# Patient Record
Sex: Female | Born: 1980 | Race: White | Hispanic: No | Marital: Single | Smoking: Current every day smoker
Health system: Southern US, Community
[De-identification: ages and names within clinical notes are randomized; demographics above are authoritative.]

## PROBLEM LIST (undated history)

## (undated) DIAGNOSIS — G8929 Other chronic pain: Secondary | ICD-10-CM

## (undated) DIAGNOSIS — F32A Depression, unspecified: Secondary | ICD-10-CM

## (undated) DIAGNOSIS — F419 Anxiety disorder, unspecified: Secondary | ICD-10-CM

## (undated) HISTORY — DX: Depression, unspecified: F32.A

## (undated) HISTORY — DX: Other chronic pain: G89.29

## (undated) HISTORY — DX: Anxiety disorder, unspecified: F41.9

## (undated) HISTORY — PX: CHOLECYSTECTOMY: SHX55

## (undated) HISTORY — PX: KNEE SURGERY: SHX244

---
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
## 1999-02-06 ENCOUNTER — Ambulatory Visit (HOSPITAL_COMMUNITY): Admission: RE | Admit: 1999-02-06 | Discharge: 1999-02-06 | Payer: Self-pay | Admitting: Obstetrics and Gynecology

## 1999-02-06 ENCOUNTER — Encounter: Payer: Self-pay | Admitting: Obstetrics and Gynecology

## 1999-02-12 ENCOUNTER — Encounter (HOSPITAL_COMMUNITY): Admission: RE | Admit: 1999-02-12 | Discharge: 1999-03-20 | Payer: Self-pay | Admitting: Obstetrics and Gynecology

## 1999-03-10 ENCOUNTER — Encounter: Payer: Self-pay | Admitting: Obstetrics and Gynecology

## 1999-03-17 ENCOUNTER — Observation Stay (HOSPITAL_COMMUNITY): Admission: AD | Admit: 1999-03-17 | Discharge: 1999-03-17 | Payer: Self-pay | Admitting: Obstetrics and Gynecology

## 1999-03-18 ENCOUNTER — Inpatient Hospital Stay (HOSPITAL_COMMUNITY): Admission: AD | Admit: 1999-03-18 | Discharge: 1999-03-21 | Payer: Self-pay | Admitting: Obstetrics and Gynecology

## 1999-03-21 ENCOUNTER — Encounter (HOSPITAL_COMMUNITY): Admission: RE | Admit: 1999-03-21 | Discharge: 1999-06-19 | Payer: Self-pay | Admitting: Obstetrics and Gynecology

## 1999-05-22 HISTORY — PX: KNEE SURGERY: SHX244

## 1999-05-22 HISTORY — PX: CHOLECYSTECTOMY: SHX55

## 2003-02-08 ENCOUNTER — Emergency Department (HOSPITAL_COMMUNITY): Admission: EM | Admit: 2003-02-08 | Discharge: 2003-02-08 | Payer: Self-pay | Admitting: Emergency Medicine

## 2009-05-21 HISTORY — PX: TUBAL LIGATION: SHX77

## 2014-06-24 ENCOUNTER — Encounter (HOSPITAL_COMMUNITY): Payer: Self-pay | Admitting: Cardiology

## 2014-06-24 ENCOUNTER — Emergency Department (HOSPITAL_COMMUNITY)
Admission: EM | Admit: 2014-06-24 | Discharge: 2014-06-24 | Disposition: A | Discharge status: Home / Self Care | Payer: Self-pay | Attending: Emergency Medicine | Admitting: Emergency Medicine

## 2014-06-24 DIAGNOSIS — M67432 Ganglion, left wrist: Secondary | ICD-10-CM | POA: Insufficient documentation

## 2014-06-24 DIAGNOSIS — Z720 Tobacco use: Secondary | ICD-10-CM | POA: Insufficient documentation

## 2014-06-24 MED ORDER — HYDROCODONE-ACETAMINOPHEN 5-325 MG PO TABS
1.0000 | ORAL_TABLET | Freq: Once | ORAL | Status: AC
  Administered 2014-06-24: 1 via ORAL
  Filled 2014-06-24: qty 1

## 2014-06-24 MED ORDER — NAPROXEN 500 MG PO TABS: 500.0000 mg | ORAL_TABLET | Freq: Two times a day (BID) | ORAL | Status: DC

## 2014-06-24 MED ORDER — NAPROXEN 250 MG PO TABS
500.0000 mg | ORAL_TABLET | Freq: Once | ORAL | Status: AC
  Administered 2014-06-24: 500 mg via ORAL
  Filled 2014-06-24: qty 2

## 2014-06-24 NOTE — ED Provider Notes (Signed)
CSN: [id number]     Arrival date & time 06/24/14  7829 History   This chart was scribed for non-physician practitioner, Donetta Potts NP working with Geoffery Lyons, MD, by Jarvis Morgan, ED Scribe. This patient was seen in room TR08C/TR08C and the patient's care was started at 9:27 AM.      Chief Complaint  Patient presents with  . Arm Pain     The history is provided by the patient. No language interpreter was used.    HPI Comments: Sharon Sullivan is a 34 y.o. female with no PMH who presents to the Emergency Department complaining of constant, gradually worsening, left wrist pain for 2 weeks. She notes an associated "knot" to her left wrist and associated swelling to the left wrist. She denies any injury to the area. Pt is able to move her left wrist but reports pain with movement. She denies any fever, chills, nausea, vomiting, diarrhea, vaginal discharge or redness to the area.    History reviewed. No pertinent past medical history. Past Surgical History  Procedure Laterality Date  . Knee surgery    . Cholecystectomy     History reviewed. No pertinent family history. History  Substance Use Topics  . Smoking status: Current Every Day Smoker  . Smokeless tobacco: Not on file  . Alcohol Use: No   OB History    No data available     Review of Systems  Constitutional: Negative for fever and chills.  Gastrointestinal: Negative for nausea, vomiting and diarrhea.  Genitourinary: Negative for vaginal discharge.  Musculoskeletal: Positive for joint swelling (left wrist) and arthralgias (left wrist).  Skin: Negative for color change.      Allergies  Review of patient's allergies indicates no known allergies.  Home Medications   Prior to Admission medications   Not on File   Triage Vitals: BP 154/93 mmHg  Pulse 91  Temp(Src) 97.8 F (36.6 C) (Oral)  Resp 16  Ht  (1.6 m)  Wt 170 lb (77.111 kg)  BMI 30.12 kg/m2  SpO2 100%  LMP 06/10/2014  Physical Exam   Constitutional: She is oriented to person, place, and time. She appears well-developed and well-nourished. No distress.  HENT:  Head: Normocephalic and atraumatic.  Eyes: Conjunctivae and EOM are normal.  Neck: Neck supple. No tracheal deviation present.  Cardiovascular: Normal rate.   Pulmonary/Chest: Effort normal. No respiratory distress.  Musculoskeletal: Normal range of motion.  Neurological: She is alert and oriented to person, place, and time.  5/5 strength with flexion and extension in left wrist and all fingers in left hand  Skin: Skin is warm and dry.  1 cm in diameter nodule to the radial aspect of anterior forearm. Tender to palpation  Psychiatric: She has a normal mood and affect. Her behavior is normal.  Nursing note and vitals reviewed.   ED Course  Procedures (including critical care time)  DIAGNOSTIC STUDIES: Oxygen Saturation is 100% on RA, normal by my interpretation.    COORDINATION OF CARE:  Labs Review Labs Reviewed - No data to display  Imaging Review No results found.   EKG Interpretation None      MDM   Final diagnoses:  Ganglion cyst of wrist, left    34 year old with ganglion cyst on wrist, no redness or heat noted.  Discussed treatment with NSAIDS and wrist brace.  Pt is well-appearing, in no acute distress and vital signs are stable.  They appear safe to be discharged.  Discharge include follow-up with  their PCP.  Return precautions provided.    I personally performed the services described in this documentation, which was scribed in my presence. The recorded information has been reviewed and is accurate.  Filed Vitals:   06/24/14 0854  BP: 154/93  Pulse: 91  Temp: 97.8 F (36.6 C)  TempSrc: Oral  Resp: 16  Height:  (1.6 m)  Weight: 170 lb (77.111 kg)  SpO2: 100%   Meds given in ED:  Medications  HYDROcodone-acetaminophen (NORCO/VICODIN) 5-325 MG per tablet 1 tablet (1 tablet Oral Given 06/24/14 0938)  naproxen (NAPROSYN)  tablet 500 mg (500 mg Oral Given 06/24/14 1610)    There are no discharge medications for this patient.  06/24/14 0000  naproxen (NAPROSYN) 500 MG tablet 2 times daily Discontinue Reprint  06/24/14 0942   Harle Battiest, NP 06/24/14 9604  Geoffery Lyons, MD 06/25/14 [phone]

## 2014-06-24 NOTE — ED Notes (Signed)
Declined W/C at D/C and was escorted to lobby by RN. 

## 2014-06-24 NOTE — Discharge Instructions (Signed)
Please follow directions provided. Use the resource guide to establish care with a primary care doctor for follow-up. Take the anti-inflammatory medicine, naproxen, twice a day and wear your splint as often as possible to help with reduction in the size of the cyst. Don't hesitate to return for any new, worsening, or concerning symptoms.  SEEK MEDICAL CARE IF:  Your ganglion becomes larger or more painful.  You have increased redness, red streaks, or swelling.  You have pus coming from the lump.  You have weakness or numbness in the affected area.   Emergency Department Resource Guide 1) Find a Doctor and Pay Out of Pocket Although you won't have to find out who is covered by your insurance plan, it is a good idea to ask around and get recommendations. You will then need to call the office and see if the doctor you have chosen will accept you as a new patient and what types of options they offer for patients who are self-pay. Some doctors offer discounts or will set up payment plans for their patients who do not have insurance, but you will need to ask so you aren't surprised when you get to your appointment.  2) Contact Your Local Health Department Not all health departments have doctors that can see patients for sick visits, but many do, so it is worth a call to see if yours does. If you don't know where your local health department is, you can check in your phone book. The CDC also has a tool to help you locate your state's health department, and many state websites also have listings of all of their local health departments.  3) Find a Walk-in Clinic If your illness is not likely to be very severe or complicated, you may want to try a walk in clinic. These are popping up all over the country in pharmacies, drugstores, and shopping centers. They're usually staffed by nurse practitioners or physician assistants that have been trained to treat common illnesses and complaints. They're usually fairly  quick and inexpensive. However, if you have serious medical issues or chronic medical problems, these are probably not your best option.  No Primary Care Doctor: - Call Health Connect at  [phone] - they can help you locate a primary care doctor that  accepts your insurance, provides certain services, etc. - Physician Referral Service- [phone]  Chronic Pain Problems: Organization         Address  Phone   Notes  [facility]  [phone] Patients need to be referred by their primary care doctor.   Medication Assistance: Organization         Address  Phone   Notes  [facility] Medication [facility] [address] [place] [place], [place] [address] [phone] --Must be a resident of [facility] -- Must have NO insurance coverage whatsoever (no Medicaid/ Medicare, etc.) -- The pt. MUST have a primary care doctor that directs their care regularly and follows them in the community   MedAssist  [phone]   [employer]  [phone]    Agencies that provide inexpensive medical care: Organization         Address  Phone   Notes  [facility]  [phone]   [facility]    [phone]   [facility] [address] [place], [place] [address] [phone]   [facility] [address] [place], [place] [phone]  Planned Parenthood    [phone]   [facility]    [phone]   Community [facility] and [address], [place] Phone:  [phone], Fax:  [phone] Hours of Operation:  9 am - 6 pm, M-F.  Also accepts Medicaid/Medicare and self-pay.  [facility] [place], [address], [place] Phone: [phone], Fax: [phone]. Hours of Operation:  8:30 am - 5:30 pm, M-F.  Also accepts Medicaid and self-pay.  [facility] [address] [place] Phone: [phone]    [place], [place], [place] [phone], Ext. [phone] Mondays & Thursdays: 7-9 AM.  First 15 patients are seen on a first come, first serve basis.    [place] Providers:  Organization         Address  Phone   Notes  [facility] [address], [place] [phone] Also accepts self-pay patients.  [facility] [address], [place]  [phone]   [place], [address], [place] [phone]   [facility] [address] [place] [phone]   Lucianne Lei [address], [place]   [phone] Only accepts [place] Access [place] patients after they have their name applied to their card.   Self-Pay (no insurance) in [facility]:  Organization         Address  Phone   Notes  Sickle Cell Patients, [facility] [place] [phone]   [facility] [place] [phone]   [facility] [place]  [place], [place], [place] [phone]   [facility]/Dr. Osei-Bonsu  [address], [place] or [address], [place] [phone] Phone number for both [place] and [place] locations is the same.  [facility] [address] [place] [phone]   [facility] [address] [place] or [address] [phone]   [facility] [address] [place] [phone], phone; [phone], fax Sees patients 1st and 3rd Saturday of every month.  Must not qualify for public or private insurance (i.e. Medicaid, Medicare, [place] Health Choice, Veterans' Benefits)  Household income should be no more than 200% of the poverty level The clinic cannot treat you if you are  pregnant or think you are pregnant  Sexually transmitted diseases are not treated at the clinic.    Dental Care: Organization         Address  Phone  Notes  [facility] Department of [facility] [place] [phone] Accepts children up to age 53 who are enrolled in [place] or [place]; pregnant women with a Medicaid card; and children who have applied for Medicaid or [place] Health Choice, but were declined, whose parents can pay a reduced fee at time of service.  [facility] of [facility]  [address], [place] [phone] Accepts children up to age 22 who are enrolled in [place] or [place]; pregnant women with a Medicaid card; and children who have applied for Medicaid or [place], but were declined,  whose parents can pay a reduced fee at time of service.  [facility] Adult [facility] Access PROGRAM  [place] [phone] Patients are seen by appointment only. Walk-ins are not accepted. [place] will see patients 63 years of age and older. Monday - Tuesday (8am-5pm) Most Wednesdays (8:30-5pm) $30 per visit, cash only  [facility] Adult Dental Access PROGRAM  [address], [facility] [phone] Patients are seen by appointment only. Walk-ins are not accepted. [place] will see patients 27 years of age and older. One Wednesday Evening (Monthly: Volunteer Based).  $30 per visit, cash only  [place]  [phone] for adults; Children under age 65, call Graduate Pediatric Dentistry at [phone]. Children aged 65-14, please call [phone] to request a pediatric application.  Dental services are provided in all areas of dental care including fillings, crowns and bridges, complete and partial dentures, implants, gum treatment, root canals, and extractions. Preventive care is also provided. Treatment is provided to  both adults and children. Patients are selected via a lottery and there is often a waiting list.   [facility] [address], [place]  [phone] [email or web address]   [facility] [address] [place], [place] [phone], Ext. [phone] Second and Fourth Thursday of each month, opens at 6:30 AM; Clinic ends at 9 AM.  Patients are seen on a first-come first-served basis, and a limited number are seen during each clinic.   [facility]  [address] Hillard Danker [place], [place] [phone]   Eligibility Requirements You must have lived in [place], [place], or [place] counties for at least the last three months.   You cannot be eligible for state or federal sponsored [employer], including [employer], [place], or [employer].   You generally cannot be eligible for healthcare insurance through your employer.    How to apply: Eligibility screenings are held every Tuesday and Wednesday afternoon from 1:00 pm until 4:00 pm. You do not need an appointment for the interview!  [facility] [address], [place], [place]   [place]  [place] Department  [place]  [phone]    Behavioral Health Resources in the Community: Intensive Outpatient Programs Organization         Address  Phone  Notes  [place]. [address], [place], [place] [phone]   [facility] [address], [place], [place]   ADS: [facility] Drug [facility] [address] [place], [place]   [place] [address]  [place], [place] or [phone]   Substance Abuse Resources Organization         Address  Phone  Notes  Alcohol and Drug Services  [phone]   [place]  [phone]   The [place]   Chinita Pester  [phone]   Residential & Outpatient Substance Abuse Program  [phone]   Psychological Services Organization         Address  Phone  Notes  [facility] [place]  [place]  [phone]   [place] [address] [place] or [phone]    Mobile Crisis Teams Organization         Address  Phone  Notes  [facility]rapeutic Alternatives, Mobile  Crisis Care Unit  [phone]   [facility]ive Psychotherapeutic Services  [address] [place], [place]   [facility] [address] [place] [phone]    Self-Help/Support Groups Organization         Address  Phone             Notes  [place]. of [place] - variety of support groups  [place] Call for more information  Narcotics Anonymous (NA), [facility] [address], [employer] [place]  2 meetings at this location   Special educational needs teacher         Address  Phone  Notes  [facility]AP Residential Treatment [place],    [place]  [phone]   [facility]  [address], [place] [address], [place], [place]   [place], [place] [phone] Admissions: 8am-3pm M-F  Incentives Substance [place] [address],    [place], [place] [phone]   [facility] [address] [place], [place], [place]   [facility] [address],  [place], [place]   Insight Programs - Intensive Outpatient [address], [place], [place]   [facility] ([place].,  [place], [place] [phone] or [phone]   [facility] Services (RTS) [address], [place], [place] Accepts Medicaid  [facility] [place] [address]  [place] [phone] Substance Abuse/Addiction Treatment   [facility] Organization         Address  Phone  Notes  [facility]  [phone]   Domenic Schwab, PhD [address] Arlis Porta [place], [place]   [phone] or [phone]   [place], [place] [phone]   [facility] [address], [place], [place] [phone] Insurance/Medicaid/sponsorship through [facility] and Families [address] [place]                                    [place], [place] [phone] [place] [address] [place], [place] [phone]    Dr. Adele Schilder  [phone]   [facility] Dept. 1) [address] [place] 2) [place] 3)  [place] [address], [place] [phone]  [phone]   [place] [phone] or [phone] (After Hours)

## 2014-06-24 NOTE — ED Notes (Signed)
Pt reports left wrist pain. Reports a knot to the area.

## 2014-07-07 ENCOUNTER — Emergency Department (HOSPITAL_COMMUNITY)
Admission: EM | Admit: 2014-07-07 | Discharge: 2014-07-08 | Disposition: A | Discharge status: Home / Self Care | Payer: Self-pay | Attending: Emergency Medicine | Admitting: Emergency Medicine

## 2014-07-07 ENCOUNTER — Emergency Department (HOSPITAL_COMMUNITY): Payer: No Typology Code available for payment source

## 2014-07-07 ENCOUNTER — Encounter (HOSPITAL_COMMUNITY): Payer: Self-pay | Admitting: Emergency Medicine

## 2014-07-07 DIAGNOSIS — Z720 Tobacco use: Secondary | ICD-10-CM | POA: Insufficient documentation

## 2014-07-07 DIAGNOSIS — M542 Cervicalgia: Secondary | ICD-10-CM

## 2014-07-07 DIAGNOSIS — Y92410 Unspecified street and highway as the place of occurrence of the external cause: Secondary | ICD-10-CM | POA: Insufficient documentation

## 2014-07-07 DIAGNOSIS — Y998 Other external cause status: Secondary | ICD-10-CM | POA: Insufficient documentation

## 2014-07-07 DIAGNOSIS — S3992XA Unspecified injury of lower back, initial encounter: Secondary | ICD-10-CM | POA: Insufficient documentation

## 2014-07-07 DIAGNOSIS — Y9389 Activity, other specified: Secondary | ICD-10-CM | POA: Insufficient documentation

## 2014-07-07 DIAGNOSIS — Z79899 Other long term (current) drug therapy: Secondary | ICD-10-CM | POA: Insufficient documentation

## 2014-07-07 DIAGNOSIS — M549 Dorsalgia, unspecified: Secondary | ICD-10-CM

## 2014-07-07 DIAGNOSIS — S199XXA Unspecified injury of neck, initial encounter: Secondary | ICD-10-CM | POA: Insufficient documentation

## 2014-07-07 MED ORDER — METHOCARBAMOL 500 MG PO TABS
500.0000 mg | ORAL_TABLET | Freq: Once | ORAL | Status: AC
  Administered 2014-07-07: 500 mg via ORAL
  Filled 2014-07-07: qty 1

## 2014-07-07 MED ORDER — OXYCODONE-ACETAMINOPHEN 5-325 MG PO TABS
2.0000 | ORAL_TABLET | Freq: Once | ORAL | Status: AC
  Administered 2014-07-07: 2 via ORAL
  Filled 2014-07-07: qty 2

## 2014-07-07 MED ORDER — LORAZEPAM 1 MG PO TABS
1.0000 mg | ORAL_TABLET | Freq: Once | ORAL | Status: AC
  Administered 2014-07-07: 1 mg via ORAL
  Filled 2014-07-07: qty 1

## 2014-07-07 MED ORDER — KETOROLAC TROMETHAMINE 60 MG/2ML IM SOLN
60.0000 mg | Freq: Once | INTRAMUSCULAR | Status: AC
  Administered 2014-07-07: 60 mg via INTRAMUSCULAR
  Filled 2014-07-07: qty 2
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
## 2014-07-08 MED ORDER — METHOCARBAMOL 500 MG PO TABS: 500.0000 mg | ORAL_TABLET | Freq: Two times a day (BID) | ORAL | Status: DC | PRN

## 2014-07-08 MED ORDER — HYDROCODONE-ACETAMINOPHEN 5-325 MG PO TABS: 1.0000 | ORAL_TABLET | Freq: Four times a day (QID) | ORAL | Status: DC | PRN

## 2014-07-08 MED ORDER — NAPROXEN 500 MG PO TABS: 500.0000 mg | ORAL_TABLET | Freq: Two times a day (BID) | ORAL | Status: DC
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
## 2021-07-24 ENCOUNTER — Other Ambulatory Visit: Payer: Self-pay

## 2021-07-24 ENCOUNTER — Encounter (HOSPITAL_COMMUNITY): Payer: Self-pay | Admitting: Emergency Medicine

## 2021-07-24 ENCOUNTER — Emergency Department (HOSPITAL_COMMUNITY): Payer: Medicaid Other

## 2021-07-24 ENCOUNTER — Emergency Department (HOSPITAL_COMMUNITY)
Admission: EM | Admit: 2021-07-24 | Discharge: 2021-07-24 | Disposition: A | Discharge status: Home / Self Care | Payer: Medicaid Other | Attending: Student | Admitting: Student

## 2021-07-24 DIAGNOSIS — G8929 Other chronic pain: Secondary | ICD-10-CM | POA: Diagnosis not present

## 2021-07-24 DIAGNOSIS — F17200 Nicotine dependence, unspecified, uncomplicated: Secondary | ICD-10-CM | POA: Diagnosis not present

## 2021-07-24 DIAGNOSIS — M545 Low back pain, unspecified: Secondary | ICD-10-CM | POA: Diagnosis not present

## 2021-07-24 LAB — I-STAT BETA HCG BLOOD, ED (MC, WL, AP ONLY): I-stat hCG, quantitative: <5 m[IU]/mL (ref <5)

## 2021-07-24 MED ORDER — KETOROLAC TROMETHAMINE 60 MG/2ML IM SOLN: 15.0000 mg | Freq: Once | INTRAMUSCULAR | Status: DC

## 2021-07-24 MED ORDER — METHOCARBAMOL 500 MG PO TABS: 500.0000 mg | ORAL_TABLET | Freq: Every evening | ORAL | 0 refills | Status: DC

## 2021-07-24 NOTE — ED Triage Notes (Signed)
Patient complains of ongoing and worsening sacral pain. Patient states she was seen for same in July 2022, told she needed surgery but her insurance would not approve it at the time. Patient states she has since obtained new coverage and requests another referral for orthopedic surgeon.

## 2021-07-24 NOTE — Discharge Instructions (Addendum)
I have provided you with the information for the orthopedic office of Dr. Sammuel Hines.  Please call and schedule an appointment with them soon as possible (in the next 24 to 48 hours).  I have also provided you with a prescription for a muscle relaxant by the name of Robaxin.  Included in your discharge packet is information this medication.  You can take one tablet at nighttime as needed.

## 2021-07-24 NOTE — ED Provider Triage Note (Signed)
Emergency Medicine Provider Triage Evaluation Note  Sharon Sullivan , a 41 y.o. female  was evaluated in triage.  Pt complains of sacral pain. Would like a referral and Rx for pain medications.  She notes that she now has new insurance at this time.  She lives in [place] however notes that she does not want to be evaluated at [place] due to how they were treating her.  She has been buying narcotics off the street to help with her symptoms.  She has been taking 15 mg of Roxicodone.  Denies bowel/bladder incontinence, abdominal pain, nausea or vomiting.  Denies recent injury, trauma, fall.  Review of Systems  Positive: As per HPI above Negative: Bowel/bladder incontinence  Physical Exam  BP (!) 145/91 (BP Location: Right Arm)    Pulse 97    Temp 97.9 F (36.6 C) (Oral)    Resp 20    SpO2 98%  Gen:   Awake, no distress  Resp:  Normal effort  MSK:   Moves extremities without difficulty  Other:  Tenderness to palpation noted to lumbar spine and musculature of the lumbar spine.  No overlying skin changes.   Medical Decision Making  Medically screening exam initiated at 12:39 PM.  Appropriate orders placed.  Sharon Sullivan was informed that the remainder of the evaluation will be completed by another provider, this initial triage assessment does not replace that evaluation, and the importance of remaining in the ED until their evaluation is complete.   Percy Comp A, PA-C 07/24/21 1256
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
## 2021-07-30 ENCOUNTER — Other Ambulatory Visit: Payer: Self-pay

## 2021-07-30 ENCOUNTER — Ambulatory Visit (INDEPENDENT_AMBULATORY_CARE_PROVIDER_SITE_OTHER): Payer: Self-pay | Admitting: Orthopaedic Surgery

## 2021-07-30 DIAGNOSIS — G8929 Other chronic pain: Secondary | ICD-10-CM

## 2021-07-30 DIAGNOSIS — M545 Low back pain, unspecified: Secondary | ICD-10-CM

## 2021-07-30 NOTE — Progress Notes (Signed)
Chief Complaint: Low back pain     History of Present Illness:    Sharon Sullivan is a 41 y.o. female presents with low back pain that is going on since 2018.  She states that she has significant pain with any type of sitting or standing.  She walks with a limp as result of this.  She has previously been seen in the emergency room where CT scan obtained showed significant disease at the L5-S1 level with spondylolisthesis that is degenerative in nature.  She states that she does not have any pain radiating down the leg.  She had a previous B12 injection which she states did not help her.  She has taken multiple forms of pain medications in the past with minimal relief.    Surgical History:   None  PMH/PSH/Family History/Social History/Meds/Allergies:   No past medical history on file. Past Surgical History:  Procedure Laterality Date   CHOLECYSTECTOMY     KNEE SURGERY     Social History   Socioeconomic History   Marital status: Single    Spouse name: Not on file   Number of children: Not on file   Years of education: Not on file   Highest education level: Not on file  Occupational History   Not on file  Tobacco Use   Smoking status: Every Day   Smokeless tobacco: Not on file  Substance and Sexual Activity   Alcohol use: No   Drug use: No   Sexual activity: Not on file  Other Topics Concern   Not on file  Social History Narrative   Not on file   Social Determinants of Health   Financial Resource Strain: Not on file  Food Insecurity: Not on file  Transportation Needs: Not on file  Physical Activity: Not on file  Stress: Not on file  Social Connections: Not on file   No family history on file. No Known Allergies Current Outpatient Medications  Medication Sig Dispense Refill   acetaminophen (TYLENOL) 500 MG tablet Take 1,000 mg by mouth every 6 (six) hours as needed for moderate pain.     gabapentin (NEURONTIN) 600 MG tablet Take  600 mg by mouth at bedtime.     HYDROcodone-acetaminophen (NORCO/VICODIN) 5-325 MG per tablet Take 1 tablet by mouth every 6 (six) hours as needed for severe pain. 11 tablet 0   ibuprofen (ADVIL,MOTRIN) 200 MG tablet Take 400 mg by mouth every 6 (six) hours as needed for moderate pain.     methocarbamol (ROBAXIN) 500 MG tablet Take 1 tablet (500 mg total) by mouth at bedtime. Not drive or operate machinery while using this medication. 14 tablet 0   naproxen (NAPROSYN) 500 MG tablet Take 1 tablet (500 mg total) by mouth 2 (two) times daily. For inflammation and mild/moderate pain 30 tablet 0   QUEtiapine (SEROQUEL) 300 MG tablet Take 300 mg by mouth at bedtime.     No current facility-administered medications for this visit.   No results found.  Review of Systems:   A ROS was performed including pertinent positives and negatives as documented in the HPI.  Physical Exam :   Constitutional: NAD and appears stated age Neurological: Alert and oriented Psych: Appropriate affect and cooperative There were no vitals taken for this visit.   Comprehensive Musculoskeletal Exam:  Tenderness palpation about the midline back.  She walks with a somewhat hunched over gait.  Denies any radiation negative straight leg raise bilaterally.  Sensation is intact in all distributions bilateral lower extremities  Imaging:   Xray (lumbar spine 4 views): Degenerative disease of the L5 1 level  CT scan lumbar spine shows a vacuum disc at the L5-S1 level with significant degenerative spondylolisthesis  I personally reviewed and interpreted the radiographs.   Assessment:   41 year old female with L5-S1 degenerative spondylolisthesis which is quite significant.  At this time I do not necessarily believe that an injection will provide any type of longer lasting relief.  She does state that she would like to consider surgery at this time she is quite disabled from the pain and would like to be more active.  As  result I will plan for an MRI and referral to my neurosurgical colleagues to discuss more if she is a surgical candidate.  Plan :    -Plan for MRI lumbar spine as well as neurosurgical spinal consultation     I personally saw and evaluated the patient, and participated in the management and treatment plan.  Huel Cote, MD Attending Physician, Orthopedic Surgery  This document was dictated using Dragon voice recognition software. A reasonable attempt at proof reading has been made to minimize errors.

## 2021-08-19 ENCOUNTER — Encounter: Payer: Self-pay | Admitting: Physical Medicine and Rehabilitation

## 2021-08-24 ENCOUNTER — Encounter: Payer: Self-pay | Admitting: *Deleted

## 2021-09-09 ENCOUNTER — Other Ambulatory Visit: Payer: Self-pay

## 2021-09-09 ENCOUNTER — Encounter
Payer: Medicaid Other | Attending: Physical Medicine and Rehabilitation | Admitting: Physical Medicine and Rehabilitation

## 2021-09-09 ENCOUNTER — Encounter: Payer: Self-pay | Admitting: Physical Medicine and Rehabilitation

## 2021-09-09 VITALS — BP 126/82 | HR 94 | Temp 98.0°F | Ht 63.0 in | Wt 150.2 lb

## 2021-09-09 DIAGNOSIS — Z79891 Long term (current) use of opiate analgesic: Secondary | ICD-10-CM | POA: Insufficient documentation

## 2021-09-09 DIAGNOSIS — M5417 Radiculopathy, lumbosacral region: Secondary | ICD-10-CM | POA: Diagnosis present

## 2021-09-09 DIAGNOSIS — M545 Low back pain, unspecified: Secondary | ICD-10-CM | POA: Insufficient documentation

## 2021-09-09 DIAGNOSIS — G8929 Other chronic pain: Secondary | ICD-10-CM | POA: Diagnosis present

## 2021-09-09 DIAGNOSIS — Z5181 Encounter for therapeutic drug level monitoring: Secondary | ICD-10-CM | POA: Diagnosis present

## 2021-09-09 DIAGNOSIS — G894 Chronic pain syndrome: Secondary | ICD-10-CM | POA: Insufficient documentation

## 2021-09-09 MED ORDER — ONDANSETRON HCL 4 MG PO TABS: 4.0000 mg | ORAL_TABLET | Freq: Three times a day (TID) | ORAL | 1 refills | Status: AC | PRN

## 2021-09-09 MED ORDER — DULOXETINE HCL 30 MG PO CPEP: 30.0000 mg | ORAL_CAPSULE | Freq: Every day | ORAL | 3 refills | Status: AC

## 2021-09-09 NOTE — Patient Instructions (Signed)
MS: ?RLE- break away weakness HF 5-/5; KE/KF 5-/5; DF 4+/5; PF 4+/5 and EHL 4-/5 ?LLE- break away weakness less notable;  ?HF 4+/5; KE/KF 4+/5; DF 4/5; PF 4+/5; EHL 4-/5 ? ?Tearful, but able ot put shoes on B/L  ?SLR (+) L>R ? ?Neuro: ?Intact to light touch in B/L LE extremities- in L1- S2 dermatomes ?DTRS slightly brisk at Patellae B/L but 2+ at Achilles B/L  ? ?Holding herself with sacrum off chair and walks limping on L with lumbar flexion notable - won't/cannot sit up straight ? ?   ?Assessment & Plan:  ? ?Pt is a 41 yr old female with hx of  chronic back pain. ?Here for evaluation  ? ?Referral in to Neurosurgery- also will need a copy of the MRI- of report and disc of actual MRI- suggest Dr Mellody Drown- have called and left message for him regarding pt.  ? ?2.  Duloxetine- Cymbalta 30 mg daily x 1 week, then 60 mg daily- for nerve and back pain.  ?1% people can have nausea- but nausea lasts 7 days- will give Zofran 4 mg 3x/day as needed for nausea ? ?3. Will get UDS and go from there. Cannot prescribe until they come back.  ?Will try and decide based on pt history.  ? ?4. Zofran as above for nausea just in case.  ? ?5. MRI scheduled 3/26- will continue that- [address]- [facility].  ? ?6. F/U in 3 months, but call after MRI so I can look at results.  ?
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
## 2021-09-13 ENCOUNTER — Other Ambulatory Visit: Payer: Medicaid Other

## 2021-09-16 ENCOUNTER — Telehealth: Payer: Self-pay | Admitting: Physical Medicine and Rehabilitation

## 2021-09-16 LAB — TOXASSURE SELECT,+ANTIDEPR,UR

## 2021-09-16 NOTE — Telephone Encounter (Signed)
Notified of decision. Non narcotic treatment only. ?
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
## 2021-09-20 ENCOUNTER — Ambulatory Visit
Admission: RE | Admit: 2021-09-20 | Discharge: 2021-09-20 | Disposition: A | Discharge status: Home / Self Care | Payer: Medicaid Other | Source: Ambulatory Visit | Attending: Orthopaedic Surgery | Admitting: Orthopaedic Surgery

## 2021-09-20 DIAGNOSIS — M545 Low back pain, unspecified: Secondary | ICD-10-CM

## 2021-11-20 ENCOUNTER — Encounter
Payer: Medicaid Other | Attending: Physical Medicine and Rehabilitation | Admitting: Physical Medicine and Rehabilitation

## 2021-11-20 DIAGNOSIS — G8929 Other chronic pain: Secondary | ICD-10-CM | POA: Insufficient documentation

## 2021-11-20 DIAGNOSIS — Z5181 Encounter for therapeutic drug level monitoring: Secondary | ICD-10-CM | POA: Insufficient documentation

## 2021-11-20 DIAGNOSIS — Z79891 Long term (current) use of opiate analgesic: Secondary | ICD-10-CM | POA: Insufficient documentation

## 2021-11-20 DIAGNOSIS — M545 Low back pain, unspecified: Secondary | ICD-10-CM | POA: Insufficient documentation

## 2021-11-20 DIAGNOSIS — M5417 Radiculopathy, lumbosacral region: Secondary | ICD-10-CM | POA: Insufficient documentation

## 2021-11-20 DIAGNOSIS — G894 Chronic pain syndrome: Secondary | ICD-10-CM | POA: Insufficient documentation

## 2021-11-30 ENCOUNTER — Other Ambulatory Visit: Payer: Self-pay | Admitting: Neurological Surgery

## 2021-11-30 ENCOUNTER — Encounter (HOSPITAL_COMMUNITY): Payer: Self-pay | Admitting: Neurological Surgery

## 2021-11-30 NOTE — Progress Notes (Signed)
Called patient, got voicemail.  Left a detailed message on machine with information and instructions for DOS.   PCP - unknown Cardiologist - n/a  Chest x-ray - n/a EKG - n/a Stress Test - n/a ECHO - n/a Cardiac Cath - n/a  ICD Pacemaker/Loop - n/a  Sleep Study -  n/a CPAP - none  Aspirin Instructions: Follow your surgeon's instructions on when to stop aspirin prior to surgery,  If no instructions were given by your surgeon then you will need to call the office for those instructions.  ERAS: Clear liquids til 8 AM DOS  STOP now taking any Aspirin (unless otherwise instructed by your surgeon), Aleve, Naproxen, Ibuprofen, Motrin, Advil, Goody's, BC's, all herbal medications, fish oil, and all vitamins.

## 2021-12-01 ENCOUNTER — Ambulatory Visit (HOSPITAL_BASED_OUTPATIENT_CLINIC_OR_DEPARTMENT_OTHER): Payer: Medicaid Other | Admitting: Anesthesiology

## 2021-12-01 ENCOUNTER — Encounter (HOSPITAL_COMMUNITY)
Admission: RE | Disposition: A | Discharge status: Home / Self Care | Payer: Self-pay | Source: Home / Self Care | Attending: Neurological Surgery

## 2021-12-01 ENCOUNTER — Other Ambulatory Visit: Payer: Self-pay

## 2021-12-01 ENCOUNTER — Encounter (HOSPITAL_COMMUNITY): Payer: Self-pay | Admitting: Neurological Surgery

## 2021-12-01 ENCOUNTER — Observation Stay (HOSPITAL_COMMUNITY)
Admission: RE | Admit: 2021-12-01 | Discharge: 2021-12-02 | Disposition: A | Discharge status: Home / Self Care | Payer: Medicaid Other | Attending: Neurological Surgery | Admitting: Neurological Surgery

## 2021-12-01 ENCOUNTER — Ambulatory Visit (HOSPITAL_COMMUNITY): Payer: Medicaid Other | Admitting: Anesthesiology

## 2021-12-01 ENCOUNTER — Ambulatory Visit (HOSPITAL_COMMUNITY): Payer: Medicaid Other

## 2021-12-01 DIAGNOSIS — M5417 Radiculopathy, lumbosacral region: Secondary | ICD-10-CM | POA: Insufficient documentation

## 2021-12-01 DIAGNOSIS — M4317 Spondylolisthesis, lumbosacral region: Secondary | ICD-10-CM

## 2021-12-01 DIAGNOSIS — F17200 Nicotine dependence, unspecified, uncomplicated: Secondary | ICD-10-CM | POA: Insufficient documentation

## 2021-12-01 DIAGNOSIS — M4807 Spinal stenosis, lumbosacral region: Secondary | ICD-10-CM | POA: Diagnosis present

## 2021-12-01 DIAGNOSIS — M4316 Spondylolisthesis, lumbar region: Principal | ICD-10-CM | POA: Diagnosis present

## 2021-12-01 HISTORY — PX: TRANSFORAMINAL LUMBAR INTERBODY FUSION W/ MIS 1 LEVEL: SHX6145

## 2021-12-01 LAB — CBC
HCT: 44.4 % (ref 36.0–46.0)
Hemoglobin: 15.5 g/dL — ABNORMAL HIGH (ref 12.0–15.0)
MCH: 32.4 pg (ref 26.0–34.0)
MCHC: 34.9 g/dL (ref 30.0–36.0)
MCV: 92.9 fL (ref 80.0–100.0)
Platelets: 281 10*3/uL (ref 150–400)
RBC: 4.78 MIL/uL (ref 3.87–5.11)
RDW: 13.2 % (ref 11.5–15.5)
WBC: 9.5 10*3/uL (ref 4.0–10.5)
nRBC: 0 % (ref 0.0–0.2)

## 2021-12-01 LAB — POCT PREGNANCY, URINE: Preg Test, Ur: NEGATIVE

## 2021-12-01 LAB — TYPE AND SCREEN
ABO/RH(D): A POS
Antibody Screen: NEGATIVE

## 2021-12-01 LAB — SURGICAL PCR SCREEN
MRSA, PCR: NEGATIVE
Staphylococcus aureus: POSITIVE — AB

## 2021-12-01 LAB — ABO/RH: ABO/RH(D): A POS

## 2021-12-01 SURGERY — MINIMALLY INVASIVE (MIS) TRANSFORAMINAL LUMBAR INTERBODY FUSION (TLIF) 1 LEVEL
Anesthesia: General | Site: Back | Laterality: Left

## 2021-12-01 MED ORDER — PROPOFOL 10 MG/ML IV BOLUS
INTRAVENOUS | Status: AC
  Filled 2021-12-01: qty 20

## 2021-12-01 MED ORDER — KETAMINE HCL 50 MG/5ML IJ SOSY
PREFILLED_SYRINGE | INTRAMUSCULAR | Status: AC
  Filled 2021-12-01: qty 5

## 2021-12-01 MED ORDER — FENTANYL CITRATE (PF) 250 MCG/5ML IJ SOLN
INTRAMUSCULAR | Status: AC
  Filled 2021-12-01: qty 5

## 2021-12-01 MED ORDER — ONDANSETRON HCL 4 MG/2ML IJ SOLN: 4.0000 mg | Freq: Four times a day (QID) | INTRAMUSCULAR | Status: DC | PRN

## 2021-12-01 MED ORDER — HYDROMORPHONE HCL 1 MG/ML IJ SOLN
INTRAMUSCULAR | Status: AC
  Filled 2021-12-01: qty 1

## 2021-12-01 MED ORDER — SODIUM CHLORIDE 0.9% FLUSH: 3.0000 mL | INTRAVENOUS | Status: DC | PRN

## 2021-12-01 MED ORDER — THROMBIN 5000 UNITS EX SOLR: OROMUCOSAL | Status: DC | PRN

## 2021-12-01 MED ORDER — CHLORHEXIDINE GLUCONATE CLOTH 2 % EX PADS: 6.0000 | MEDICATED_PAD | Freq: Once | CUTANEOUS | Status: DC

## 2021-12-01 MED ORDER — ROCURONIUM BROMIDE 10 MG/ML (PF) SYRINGE
PREFILLED_SYRINGE | INTRAVENOUS | Status: DC | PRN
  Administered 2021-12-01: 70 mg via INTRAVENOUS

## 2021-12-01 MED ORDER — ACETAMINOPHEN 160 MG/5ML PO SOLN: 1000.0000 mg | Freq: Once | ORAL | Status: DC | PRN

## 2021-12-01 MED ORDER — HYDROMORPHONE HCL 1 MG/ML IJ SOLN
1.0000 mg | INTRAMUSCULAR | Status: DC | PRN
  Administered 2021-12-01: 1 mg via INTRAVENOUS

## 2021-12-01 MED ORDER — ACETAMINOPHEN 325 MG PO TABS
650.0000 mg | ORAL_TABLET | ORAL | Status: DC | PRN
  Administered 2021-12-01 – 2021-12-02 (×2): 650 mg via ORAL
  Filled 2021-12-01 (×3): qty 2

## 2021-12-01 MED ORDER — OXYCODONE HCL 5 MG PO TABS: 10.0000 mg | ORAL_TABLET | ORAL | Status: DC | PRN

## 2021-12-01 MED ORDER — CHLORHEXIDINE GLUCONATE 0.12 % MT SOLN
OROMUCOSAL | Status: AC
  Administered 2021-12-01: 15 mL via OROMUCOSAL
  Filled 2021-12-01: qty 15

## 2021-12-01 MED ORDER — CHLORHEXIDINE GLUCONATE 0.12 % MT SOLN: 15.0000 mL | Freq: Once | OROMUCOSAL | Status: AC

## 2021-12-01 MED ORDER — CEFAZOLIN SODIUM-DEXTROSE 2-3 GM-%(50ML) IV SOLR
INTRAVENOUS | Status: DC | PRN
  Administered 2021-12-01: 2 g via INTRAVENOUS

## 2021-12-01 MED ORDER — ONDANSETRON HCL 4 MG PO TABS: 4.0000 mg | ORAL_TABLET | Freq: Four times a day (QID) | ORAL | Status: DC | PRN

## 2021-12-01 MED ORDER — ACETAMINOPHEN 10 MG/ML IV SOLN
1000.0000 mg | Freq: Once | INTRAVENOUS | Status: DC | PRN
  Administered 2021-12-01: 1000 mg via INTRAVENOUS

## 2021-12-01 MED ORDER — LIDOCAINE-EPINEPHRINE 1 %-1:100000 IJ SOLN
INTRAMUSCULAR | Status: DC | PRN
  Administered 2021-12-01: 10 mL

## 2021-12-01 MED ORDER — HYDROMORPHONE HCL 1 MG/ML IJ SOLN
1.0000 mg | INTRAMUSCULAR | Status: DC | PRN
  Administered 2021-12-01: 1 mg via INTRAVENOUS
  Filled 2021-12-01: qty 1

## 2021-12-01 MED ORDER — LACTATED RINGERS IV SOLN: INTRAVENOUS | Status: DC

## 2021-12-01 MED ORDER — MENTHOL 3 MG MT LOZG: 1.0000 | LOZENGE | OROMUCOSAL | Status: DC | PRN

## 2021-12-01 MED ORDER — SODIUM CHLORIDE 0.9% FLUSH: 3.0000 mL | Freq: Two times a day (BID) | INTRAVENOUS | Status: DC

## 2021-12-01 MED ORDER — FENTANYL CITRATE (PF) 100 MCG/2ML IJ SOLN
INTRAMUSCULAR | Status: AC
  Filled 2021-12-01: qty 2

## 2021-12-01 MED ORDER — POLYETHYLENE GLYCOL 3350 17 G PO PACK
17.0000 g | PACK | Freq: Every day | ORAL | Status: DC | PRN
Start: 2021-12-01 — End: 2021-12-02

## 2021-12-01 MED ORDER — CEFAZOLIN SODIUM-DEXTROSE 2-4 GM/100ML-% IV SOLN
2.0000 g | Freq: Three times a day (TID) | INTRAVENOUS | Status: AC
  Administered 2021-12-01 – 2021-12-02 (×2): 2 g via INTRAVENOUS
  Filled 2021-12-01 (×2): qty 100

## 2021-12-01 MED ORDER — ACETAMINOPHEN 500 MG PO TABS: 1000.0000 mg | ORAL_TABLET | Freq: Once | ORAL | Status: DC | PRN

## 2021-12-01 MED ORDER — CYCLOBENZAPRINE HCL 10 MG PO TABS
10.0000 mg | ORAL_TABLET | Freq: Three times a day (TID) | ORAL | Status: DC | PRN
  Administered 2021-12-01: 10 mg via ORAL
  Filled 2021-12-01: qty 1

## 2021-12-01 MED ORDER — LORAZEPAM 2 MG/ML IJ SOLN
INTRAMUSCULAR | Status: AC
  Filled 2021-12-01: qty 1

## 2021-12-01 MED ORDER — LIDOCAINE 2% (20 MG/ML) 5 ML SYRINGE
INTRAMUSCULAR | Status: DC | PRN
  Administered 2021-12-01: 60 mg via INTRAVENOUS

## 2021-12-01 MED ORDER — CEFAZOLIN SODIUM-DEXTROSE 2-4 GM/100ML-% IV SOLN: 2.0000 g | INTRAVENOUS | Status: DC

## 2021-12-01 MED ORDER — FENTANYL CITRATE (PF) 250 MCG/5ML IJ SOLN
INTRAMUSCULAR | Status: DC | PRN
Start: 2021-12-01 — End: 2021-12-01
  Administered 2021-12-01 (×2): 100 ug via INTRAVENOUS
  Administered 2021-12-01: 50 ug via INTRAVENOUS

## 2021-12-01 MED ORDER — OXYCODONE HCL 5 MG/5ML PO SOLN: 5.0000 mg | Freq: Once | ORAL | Status: DC | PRN

## 2021-12-01 MED ORDER — LORAZEPAM 2 MG/ML IJ SOLN
0.5000 mg | Freq: Once | INTRAMUSCULAR | Status: AC
Start: 2021-12-01 — End: 2021-12-01
  Administered 2021-12-01: 0.5 mg via INTRAVENOUS

## 2021-12-01 MED ORDER — SODIUM CHLORIDE 0.9 % IV SOLN: 250.0000 mL | INTRAVENOUS | Status: DC

## 2021-12-01 MED ORDER — KETAMINE HCL 10 MG/ML IJ SOLN
INTRAMUSCULAR | Status: DC | PRN
  Administered 2021-12-01: 40 mg via INTRAVENOUS
  Administered 2021-12-01: 10 mg via INTRAVENOUS

## 2021-12-01 MED ORDER — ORAL CARE MOUTH RINSE: 15.0000 mL | Freq: Once | OROMUCOSAL | Status: AC

## 2021-12-01 MED ORDER — OXYCODONE HCL 5 MG PO TABS: 5.0000 mg | ORAL_TABLET | Freq: Once | ORAL | Status: DC | PRN

## 2021-12-01 MED ORDER — MIDAZOLAM HCL 2 MG/2ML IJ SOLN
INTRAMUSCULAR | Status: AC
Start: 2021-12-01 — End: ?
  Filled 2021-12-01: qty 2

## 2021-12-01 MED ORDER — FENTANYL CITRATE (PF) 100 MCG/2ML IJ SOLN
25.0000 ug | INTRAMUSCULAR | Status: DC | PRN
  Administered 2021-12-01 (×3): 50 ug via INTRAVENOUS

## 2021-12-01 MED ORDER — ACETAMINOPHEN 650 MG RE SUPP: 650.0000 mg | RECTAL | Status: DC | PRN

## 2021-12-01 MED ORDER — GABAPENTIN 600 MG PO TABS
600.0000 mg | ORAL_TABLET | Freq: Every day | ORAL | Status: DC
  Administered 2021-12-01: 600 mg via ORAL
  Filled 2021-12-01: qty 1

## 2021-12-01 MED ORDER — ACETAMINOPHEN 10 MG/ML IV SOLN
INTRAVENOUS | Status: AC
  Filled 2021-12-01: qty 100

## 2021-12-01 MED ORDER — LIDOCAINE-EPINEPHRINE 1 %-1:100000 IJ SOLN
INTRAMUSCULAR | Status: AC
  Filled 2021-12-01: qty 1

## 2021-12-01 MED ORDER — PHENOL 1.4 % MT LIQD: 1.0000 | OROMUCOSAL | Status: DC | PRN

## 2021-12-01 MED ORDER — OXYCODONE HCL 5 MG PO TABS
15.0000 mg | ORAL_TABLET | ORAL | Status: DC | PRN
  Administered 2021-12-01 – 2021-12-02 (×3): 15 mg via ORAL
  Filled 2021-12-01 (×4): qty 3

## 2021-12-01 MED ORDER — HYDROMORPHONE HCL 1 MG/ML IJ SOLN
INTRAMUSCULAR | Status: AC
  Filled 2021-12-01: qty 2

## 2021-12-01 MED ORDER — THROMBIN 5000 UNITS EX SOLR
CUTANEOUS | Status: AC
  Filled 2021-12-01: qty 5000

## 2021-12-01 MED ORDER — SUGAMMADEX SODIUM 200 MG/2ML IV SOLN
INTRAVENOUS | Status: DC | PRN
  Administered 2021-12-01: 200 mg via INTRAVENOUS

## 2021-12-01 MED ORDER — MIDAZOLAM HCL 2 MG/2ML IJ SOLN
INTRAMUSCULAR | Status: DC | PRN
  Administered 2021-12-01: 2 mg via INTRAVENOUS

## 2021-12-01 MED ORDER — DEXAMETHASONE SODIUM PHOSPHATE 10 MG/ML IJ SOLN
INTRAMUSCULAR | Status: DC | PRN
  Administered 2021-12-01: 10 mg via INTRAVENOUS

## 2021-12-01 MED ORDER — PROPOFOL 10 MG/ML IV BOLUS
INTRAVENOUS | Status: DC | PRN
  Administered 2021-12-01: 200 mg via INTRAVENOUS

## 2021-12-01 MED ORDER — ONDANSETRON HCL 4 MG/2ML IJ SOLN
INTRAMUSCULAR | Status: DC | PRN
  Administered 2021-12-01: 4 mg via INTRAVENOUS

## 2021-12-01 MED ORDER — HYDROMORPHONE HCL 1 MG/ML IJ SOLN
0.2500 mg | INTRAMUSCULAR | Status: DC | PRN
  Administered 2021-12-01 (×4): 0.5 mg via INTRAVENOUS

## 2021-12-01 MED ORDER — DOCUSATE SODIUM 100 MG PO CAPS
100.0000 mg | ORAL_CAPSULE | Freq: Two times a day (BID) | ORAL | Status: DC
  Administered 2021-12-01: 100 mg via ORAL
  Filled 2021-12-01: qty 1

## 2021-12-01 MED ORDER — 0.9 % SODIUM CHLORIDE (POUR BTL) OPTIME
TOPICAL | Status: DC | PRN
  Administered 2021-12-01: 1000 mL

## 2021-12-01 SURGICAL SUPPLY — 64 items
BAG COUNTER SPONGE SURGICOUNT (BAG) ×2 IMPLANT
BAND RUBBER #18 3X1/16 STRL (MISCELLANEOUS) ×4 IMPLANT
BASKET BONE COLLECTION (BASKET) ×2 IMPLANT
BLADE CLIPPER SURG (BLADE) IMPLANT
BLADE SURG 11 STRL SS (BLADE) ×2 IMPLANT
BUR MATCHSTICK NEURO 3.0 LAGG (BURR) IMPLANT
BUR ROUND FLUTED 4 SOFT TCH (BURR) ×2 IMPLANT
BUR ROUND PRECISION 4.0 (BURR) IMPLANT
CAGE INTERBODY SHORT 7X29X24 (Cage) ×1 IMPLANT
CNTNR URN SCR LID CUP LEK RST (MISCELLANEOUS) ×1 IMPLANT
CONT SPEC 4OZ STRL OR WHT (MISCELLANEOUS) ×1
COVER BACK TABLE 60X90IN (DRAPES) ×2 IMPLANT
DERMABOND ADVANCED (GAUZE/BANDAGES/DRESSINGS) ×1
DERMABOND ADVANCED .7 DNX12 (GAUZE/BANDAGES/DRESSINGS) ×1 IMPLANT
DRAPE C-ARM 42X72 X-RAY (DRAPES) ×2 IMPLANT
DRAPE C-ARMOR (DRAPES) ×2 IMPLANT
DRAPE LAPAROTOMY 100X72X124 (DRAPES) ×2 IMPLANT
DRAPE MICROSCOPE LEICA (MISCELLANEOUS) ×2 IMPLANT
DRAPE SURG 17X23 STRL (DRAPES) ×4 IMPLANT
ELECT BLADE 6.5 EXT (BLADE) ×2 IMPLANT
ELECT REM PT RETURN 9FT ADLT (ELECTROSURGICAL) ×2
ELECTRODE REM PT RTRN 9FT ADLT (ELECTROSURGICAL) ×1 IMPLANT
EXTENDER TAB GUIDE SV 5.5/6.0 (INSTRUMENTS) ×8 IMPLANT
GAUZE 4X4 16PLY ~~LOC~~+RFID DBL (SPONGE) IMPLANT
GAUZE SPONGE 4X4 12PLY STRL (GAUZE/BANDAGES/DRESSINGS) ×2 IMPLANT
GLOVE BIOGEL PI IND STRL 7.5 (GLOVE) ×1 IMPLANT
GLOVE BIOGEL PI INDICATOR 7.5 (GLOVE) ×1
GLOVE ECLIPSE 7.5 STRL STRAW (GLOVE) ×2 IMPLANT
GOWN STRL REUS W/ TWL LRG LVL3 (GOWN DISPOSABLE) ×1 IMPLANT
GOWN STRL REUS W/ TWL XL LVL3 (GOWN DISPOSABLE) IMPLANT
GOWN STRL REUS W/TWL 2XL LVL3 (GOWN DISPOSABLE) IMPLANT
GOWN STRL REUS W/TWL LRG LVL3 (GOWN DISPOSABLE) ×1
GOWN STRL REUS W/TWL XL LVL3 (GOWN DISPOSABLE)
GUIDEWIRE BLUNT NT 450 (WIRE) ×4 IMPLANT
HEMOSTAT POWDER KIT SURGIFOAM (HEMOSTASIS) ×2 IMPLANT
KIT BASIN OR (CUSTOM PROCEDURE TRAY) ×2 IMPLANT
KIT POSITION SURG JACKSON T1 (MISCELLANEOUS) ×2 IMPLANT
KIT TURNOVER KIT B (KITS) IMPLANT
NDL BEVEL TWO-PAK W/1PK (NEEDLE) IMPLANT
NDL HYPO 18GX1.5 BLUNT FILL (NEEDLE) IMPLANT
NDL SPNL 18GX3.5 QUINCKE PK (NEEDLE) IMPLANT
NEEDLE BEVEL TWO-PAK W/1PK (NEEDLE) ×2 IMPLANT
NEEDLE HYPO 18GX1.5 BLUNT FILL (NEEDLE) IMPLANT
NEEDLE HYPO 22GX1.5 SAFETY (NEEDLE) ×2 IMPLANT
NEEDLE SPNL 18GX3.5 QUINCKE PK (NEEDLE) IMPLANT
NS IRRIG 1000ML POUR BTL (IV SOLUTION) ×2 IMPLANT
PACK LAMINECTOMY NEURO (CUSTOM PROCEDURE TRAY) ×2 IMPLANT
PAD ARMBOARD 7.5X6 YLW CONV (MISCELLANEOUS) ×4 IMPLANT
ROD PERC CCM 5.5X35 (Rod) ×2 IMPLANT
SCREW FENS MAS CCM 6.5X30 (Screw) ×2 IMPLANT
SCREW MAS VOYAGER 6.5X35 (Screw) ×2 IMPLANT
SCREW SET 5.5/6.0MM SOLERA (Screw) ×4 IMPLANT
SLEEVE SCD COMPRESS KNEE MED (STOCKING) ×1 IMPLANT
SPIKE FLUID TRANSFER (MISCELLANEOUS) ×2 IMPLANT
SPONGE T-LAP 4X18 ~~LOC~~+RFID (SPONGE) IMPLANT
SUT MNCRL AB 3-0 PS2 18 (SUTURE) ×2 IMPLANT
SUT VIC AB 0 CT1 18XCR BRD8 (SUTURE) IMPLANT
SUT VIC AB 0 CT1 8-18 (SUTURE)
SUT VIC AB 2-0 CP2 18 (SUTURE) ×2 IMPLANT
SYR 3ML LL SCALE MARK (SYRINGE) IMPLANT
TOWEL GREEN STERILE (TOWEL DISPOSABLE) ×2 IMPLANT
TOWEL GREEN STERILE FF (TOWEL DISPOSABLE) ×2 IMPLANT
TRAY FOLEY MTR SLVR 16FR STAT (SET/KITS/TRAYS/PACK) IMPLANT
WATER STERILE IRR 1000ML POUR (IV SOLUTION) ×2 IMPLANT

## 2021-12-01 NOTE — Op Note (Signed)
PATIENT: Sharon Sullivan  DAY OF SURGERY: 12/01/21   PRE-OPERATIVE DIAGNOSIS:  Lumbar spondylolisthesis, lumbar radiculopathy   POST-OPERATIVE DIAGNOSIS:  Same   PROCEDURE:  Left L5-S1 minimally invasive transforaminal lumbar interbody fusion    SURGEON:  Surgeon(s) and Role:    Jadene Pierini, MD - Primary    Lisbeth Renshaw, MD - Assisting   ANESTHESIA: ETGA   BRIEF HISTORY: This is a 41 year old woman who presented with low back and bilateral lower extremity pain. The patient was found to have a spondylolisthesis at L5-S1 with left greater than right foraminal stenosis. Her symptoms were refractory to nonsurgical treatment measures, I therefore recommended surgical intervention in the form of an L5-S1 MIS TLIF. This was discussed with the patient as well as risks, benefits, and alternatives and wished to proceed with surgery.   OPERATIVE DETAIL:  The patient was taken to the operating room and placed on the OR table in the prone position. A formal time out was performed with two patient identifiers and confirmed the operative site. Anesthesia was induced by the anesthesia team. The operative site was marked, hair was clipped with surgical clippers, the area was then prepped and draped in a sterile fashion.   Fluoroscopy was used to localize the surgical level. The pedicles were marked and used to create skin incisions bilaterally. With fluoro guidance, Jamshidi needles were used to guide K-wires into the bilateral L5 and S1 pedicles. The K wires were then secured with hemostats and attention turned to the TLIF.  A [employer] tube was then docked to the left L5-S1 facet through the same incision using fluoroscopy. A left L5-S1 facetectomy was performed and the left L5 nerve root was decompressed along its entire course. The disc space was identified, incised, and a discectomy was performed in the standard fashion. The endplates were prepped. The inferior endplate of L5 was very soft and  easily violated with disc space instruments, necessitating extra care. I therefore opted for use of an angled graft to get further extension into the annular aspect of the endplate for better strength, which was quite helpful. The graft was packed into the disc space, and an expandable cage ([employer]) was packed with autograft and placed into the disc space with fluoroscopic confirmation. The tube was removed and hemostasis was obtained during its removal.   Using the previously placed K wires, a tap and then screw with tower were placed bilaterally at L5 and S1. A rod was sized and introduced on both sides, confirmed with fluoroscopy, then final tightened. Hemostasis was again confirmed for both incisions, they were copiously irrigated, and then closed in layers.    EBL:  42mL   DRAINS: none   SPECIMENS: none   Jadene Pierini, MD 12/01/21 3:26 PM

## 2021-12-01 NOTE — Anesthesia Preprocedure Evaluation (Signed)
Anesthesia Evaluation  Patient identified by MRN, date of birth, ID band Patient awake    Reviewed: Allergy & Precautions, NPO status , Patient's Chart, lab work & pertinent test results  History of Anesthesia Complications Negative for: history of anesthetic complications  Airway Mallampati: I  TM Distance: >3 FB Neck ROM: Full    Dental  (+) Edentulous Upper, Edentulous Lower, Dental Advisory Given   Pulmonary neg shortness of breath, neg sleep apnea, neg COPD, neg recent URI, Current SmokerPatient did not abstain from smoking.,    breath sounds clear to auscultation       Cardiovascular negative cardio ROS   Rhythm:Regular     Neuro/Psych negative neurological ROS  negative psych ROS   GI/Hepatic negative GI ROS, Neg liver ROS,   Endo/Other  negative endocrine ROS  Renal/GU negative Renal ROS     Musculoskeletal negative musculoskeletal ROS (+)   Abdominal   Peds  Hematology negative hematology ROS (+)   Anesthesia Other Findings Chronic opioids  Reproductive/Obstetrics                             Anesthesia Physical Anesthesia Plan  ASA: 2  Anesthesia Plan: General   Post-op Pain Management: Ofirmev IV (intra-op)*, Toradol IV (intra-op)* and Ketamine IV*   Induction: Intravenous  PONV Risk Score and Plan: 2 and Ondansetron and Dexamethasone  Airway Management Planned: Oral ETT  Additional Equipment:   Intra-op Plan:   Post-operative Plan: Extubation in OR  Informed Consent: I have reviewed the patients History and Physical, chart, labs and discussed the procedure including the risks, benefits and alternatives for the proposed anesthesia with the patient or authorized representative who has indicated his/her understanding and acceptance.     Dental advisory given  Plan Discussed with: CRNA  Anesthesia Plan Comments:         Anesthesia Quick Evaluation

## 2021-12-01 NOTE — Transfer of Care (Signed)
Immediate Anesthesia Transfer of Care Note  Patient: Sharon Sullivan  Procedure(s) Performed: Left L5-S1 MIS TLIF (Left: Back)  Patient Location: PACU  Anesthesia Type:General  Level of Consciousness: drowsy and patient cooperative  Airway & Oxygen Therapy: Patient Spontanous Breathing and Patient connected to nasal cannula oxygen  Post-op Assessment: Report given to RN and Post -op Vital signs reviewed and stable  Post vital signs: Reviewed and stable  Last Vitals:  Vitals Value Taken Time  BP 123/84 12/01/21 1524  Temp    Pulse 87 12/01/21 1525  Resp 14 12/01/21 1525  SpO2 97 % 12/01/21 1525  Vitals shown include unvalidated device data.  Last Pain:  Vitals:   12/01/21 0931  PainSc: 9       Patients Stated Pain Goal: 2 (12/01/21 0931)  Complications: No notable events documented.

## 2021-12-01 NOTE — H&P (Signed)
Surgical H&P Update  HPI: 41 y.o. woman with a history of low back pain, lower extremity radicular pain. Workup showed a spondylolisthesis at L5-S1 with severe left and moderate right foraminal stenosis. Her symptoms were refractory to non-surgical treatment and she developed some left foot weakness, so I recommended surgical treatment in the form of an MIS TLIF. No changes in health since they were last seen. Still having the above and wishes to proceed with surgery.  PMHx: History reviewed. No pertinent past medical history. FamHx: History reviewed. No pertinent family history. SocHx:  reports that she has been smoking. She does not have any smokeless tobacco history on file. She reports that she does not drink alcohol and does not use drugs.  Physical Exam: Strength 5/5 x4 and SILTx4 except left PF/EHL/TA/inversion 4+/5 with intact 5/5 eversion, +SLR bilaterally R>L and mild foot numbness in the dorsum bilaterally  Assesment/Plan: 41 y.o. woman with L5-S1 spondylolisthesis and foraminal stenosis, here for L5-S1 MIS TLIF. Risks, benefits, and alternatives discussed and the patient would like to continue with surgery.  -OR today -3C post-op  Jadene Pierini, MD 12/01/21 10:04 AM

## 2021-12-01 NOTE — Anesthesia Procedure Notes (Signed)
Procedure Name: Intubation Date/Time: 12/01/2021 12:29 PM  Performed by: Eligha Bridegroom, CRNAPre-anesthesia Checklist: Patient identified Patient Re-evaluated:Patient Re-evaluated prior to induction Oxygen Delivery Method: Circle system utilized Preoxygenation: Pre-oxygenation with 100% oxygen Induction Type: IV induction Ventilation: Mask ventilation without difficulty Laryngoscope Size: Mac and 3 Grade View: Grade I Tube type: Oral Tube size: 7.0 mm Airway Equipment and Method: Stylet Placement Confirmation: ETT inserted through vocal cords under direct vision, positive ETCO2 and breath sounds checked- equal and bilateral Secured at: 21 cm Tube secured with: Tape Dental Injury: Teeth and Oropharynx as per pre-operative assessment

## 2021-12-01 NOTE — Progress Notes (Signed)
Noted swelling , hard on palpation on patient's surgical incision going towards left upper buttock area  more than a size of a tennis ball. Patient c/o extreme pain on lower back not controlled with current pain regimen. No c/o pain not numbness on BLE  noted. Patient able to move BLE well and able to walk and go to BR. On call MD notified- Dr Conchita Paris  and updated about patient current status. New order received to change Dilaudid for every 2 hrs PRN to control pain. And Md to check on patient in the morning. Patient and spouse  informed about plan of care. Ice pack applied to swollen surgical site. Will continue to monitor closely.

## 2021-12-02 DIAGNOSIS — M4807 Spinal stenosis, lumbosacral region: Secondary | ICD-10-CM | POA: Diagnosis not present

## 2021-12-02 MED ORDER — CYCLOBENZAPRINE HCL 10 MG PO TABS: 10.0000 mg | ORAL_TABLET | Freq: Three times a day (TID) | ORAL | 0 refills | Status: DC | PRN

## 2021-12-02 MED ORDER — OXYCODONE HCL 15 MG PO TABS: 15.0000 mg | ORAL_TABLET | ORAL | 0 refills | Status: AC | PRN

## 2021-12-02 NOTE — Evaluation (Signed)
Physical Therapy Evaluation & Discharge Patient Details Name: Sharon Sullivan MRN: [id number] DOB: 1980/11/23 Today's Date: 12/02/2021  History of Present Illness  Pt is a 41 y.o. female s/p L5-S1 minimally invasive TLIF on 12/01/21. PMH includes tobacco use, L knee sx.  Clinical Impression  Patient evaluated by Physical Therapy with no further acute PT needs identified. PTA, pt independent, lives with spouse, reports dealing with back pain for >2 yrs. Today, pt ambulatory with and without RW at supervision-level; pt moving with significant forward flexed posture in attempt to decrease pain, able to achieve more upright posture with cues. Pt with preference to wear her lumbar corset for comfort with mobility. Educ re: precautions, positioning, activity recommendations. All education has been completed and the patient has no further questions. Acute PT is signing off. Thank you for this referral.     Recommendations for follow up therapy are one component of a multi-disciplinary discharge planning process, led by the attending physician.  Recommendations may be updated based on patient status, additional functional criteria and insurance authorization.  Follow Up Recommendations No PT follow up    Assistance Recommended at Discharge Intermittent Supervision/Assistance  Patient can return home with the following  A little help with bathing/dressing/bathroom;Assistance with cooking/housework;Assist for transportation;Help with stairs or ramp for entrance    Equipment Recommendations None recommended by PT  Recommendations for Other Services   N/A   Functional Status Assessment       Precautions / Restrictions Precautions Precautions: Back;Fall Precaution Booklet Issued: Yes (comment) Precaution Comments: educated  on 3/3 back precautions Required Braces or Orthoses: Other Brace Spinal Brace: Lumbar corset Other Brace: pt has her own elastic lumbar corset for comfort Restrictions Weight  Bearing Restrictions: No      Mobility  Bed Mobility Overal bed mobility: Modified Independent Bed Mobility: Rolling, Sidelying to Sit, Sit to Sidelying                Transfers Overall transfer level: Modified independent Equipment used: Rolling walker (2 wheels), None Transfers: Sit to/from Stand                  Ambulation/Gait Ambulation/Gait assistance: Supervision Gait Distance (Feet): 24 Feet Assistive device: Rolling walker (2 wheels), None Gait Pattern/deviations: Step-to pattern, Step-through pattern, Decreased stride length, Trunk flexed, Antalgic Gait velocity: Decreased     General Gait Details: pt ambulating laps in room initially with RW, cues for upright posture and closer proximity to RW as pt with very forward flexed, antalgic posture; noted that when pt performing task not holding onto RW (i.e. donning lumbar corset), pt with apparent improved stability and ability to maintain upright/trunk extension  Stairs Stairs:  (pt declined stair training; educ on safe technique with rail support)          Wheelchair Mobility    Modified Rankin (Stroke Patients Only)       Balance Overall balance assessment: Needs assistance Sitting-balance support: No upper extremity supported, Feet supported Sitting balance-Leahy Scale: Good       Standing balance-Leahy Scale: Fair Standing balance comment: stability improved with RW                             Pertinent Vitals/Pain Pain Assessment Pain Assessment: Faces Faces Pain Scale: Hurts whole lot Pain Location: back Pain Descriptors / Indicators: Discomfort, Grimacing, Guarding Pain Intervention(s): Monitored during session, Limited activity within patient's tolerance    Home Living Family/patient expects to be  discharged to:: Private residence Living Arrangements: Spouse/significant other Available Help at Discharge: Family;Available 24 hours/day Type of Home: House Home  Access: Stairs to enter Entrance Stairs-Rails: Doctor, general practice of Steps: 5   Home Layout: One level Home Equipment: Agricultural consultant (2 wheels);Shower seat;BSC/3in1 Additional Comments: pt's mom is planning to come/assist at d/c    Prior Function Prior Level of Function : Independent/Modified Independent             Mobility Comments: typically indep without DME, wears lumbar corset for comfort due to ~2 yr h/o back pain ADLs Comments: indep with ADLs/iADLs     Hand Dominance        Extremity/Trunk Assessment   Upper Extremity Assessment Upper Extremity Assessment: Overall WFL for tasks assessed    Lower Extremity Assessment Lower Extremity Assessment: Overall WFL for tasks assessed    Cervical / Trunk Assessment Cervical / Trunk Assessment: Back Surgery  Communication   Communication: No difficulties  Cognition Arousal/Alertness: Awake/alert Behavior During Therapy: WFL for tasks assessed/performed Overall Cognitive Status: Within Functional Limits for tasks assessed                                 General Comments: pt reports, "I'm sorry, all I can think about is getting out of here so I can smoke a cigarette"        General Comments General comments (skin integrity, edema, etc.): pleasant and cooperative; denies further questions or concerns; pt continues to ask when she can d/c home, wants a cigarette.    Exercises     Assessment/Plan    PT Assessment Patient does not need any further PT services  PT Problem List         PT Treatment Interventions      PT Goals (Current goals can be found in the Care Plan section)  Acute Rehab PT Goals PT Goal Formulation: All assessment and education complete, DC therapy    Frequency       Co-evaluation               AM-PAC PT "6 Clicks" Mobility  Outcome Measure Help needed turning from your back to your side while in a flat bed without using bedrails?: None Help  needed moving from lying on your back to sitting on the side of a flat bed without using bedrails?: None Help needed moving to and from a bed to a chair (including a wheelchair)?: None Help needed standing up from a chair using your arms (e.g., wheelchair or bedside chair)?: None Help needed to walk in hospital room?: A Little Help needed climbing 3-5 steps with a railing? : A Little 6 Click Score: 22    End of Session Equipment Utilized During Treatment: Gait belt;Back brace Activity Tolerance: Patient tolerated treatment well;Patient limited by pain Patient left: in bed;with call bell/phone within reach Nurse Communication: Mobility status PT Visit Diagnosis: Other abnormalities of gait and mobility (R26.89);Pain    Time: 1610-9604 PT Time Calculation (min) (ACUTE ONLY): 10 min   Charges:   PT Evaluation $PT Eval Low Complexity: 1 Low     Sharon Sullivan, PT, DPT Acute Rehabilitation Services  Pager [phone] Office [phone]  Malachy Chamber 12/02/2021, 9:12 AM

## 2021-12-02 NOTE — Evaluation (Signed)
Occupational Therapy Evaluation Patient Details Name: Sharon Sullivan MRN: [id number] DOB: [birth year] Today's Date: 12/02/2021   History of Present Illness Pt is a 41 y.o. female s/p L5-S1 minimally invasive TLIF on 12/01/21. PMH includes tobacco use, L knee sx.   Clinical Impression   Pt independent at baseline with ADLs and functional mobility, lives with spouse and plans to have her mother stay with her at d/c. Pt currently mod I -minA for ADLs, supervision for bed mobility and transfers with  and without RW. Pt Educated on compensatory strategies for ADLs and precautions. Pt able to verbalize understanding and demonstrate during session. Pt presenting with impairments listed below, will follow acutely. Recommend d/c home with assistance.      Recommendations for follow up therapy are one component of a multi-disciplinary discharge planning process, led by the attending physician.  Recommendations may be updated based on patient status, additional functional criteria and insurance authorization.   Follow Up Recommendations  No OT follow up    Assistance Recommended at Discharge Set up Supervision/Assistance  Patient can return home with the following A little help with bathing/dressing/bathroom;A little help with walking and/or transfers;Assistance with cooking/housework;Assist for transportation;Help with stairs or ramp for entrance    Functional Status Assessment  Patient has had a recent decline in their functional status and demonstrates the ability to make significant improvements in function in a reasonable and predictable amount of time.  Equipment Recommendations  None recommended by OT (pt has all needed DME)    Recommendations for Other Services PT consult     Precautions / Restrictions Precautions Precautions: Back;Fall Precaution Booklet Issued: Yes (comment) Precaution Comments: educated  on 3/3 back precautions Required Braces or Orthoses: Other Brace Spinal Brace:  Lumbar corset Other Brace: pt has her own elastic lumbar corset for comfort Restrictions Weight Bearing Restrictions: No      Mobility Bed Mobility Overal bed mobility: Needs Assistance Bed Mobility: Sidelying to Sit, Sit to Sidelying   Sidelying to sit: Supervision     Sit to sidelying: Supervision General bed mobility comments: use of log roll technique    Transfers Overall transfer level: Needs assistance Equipment used: Rolling walker (2 wheels) Transfers: Sit to/from Stand Sit to Stand: Supervision           General transfer comment: holding onto walls/furniture to walk in room, use of RW outside of room      Balance Overall balance assessment: Mild deficits observed, not formally tested                                         ADL either performed or assessed with clinical judgement   ADL Overall ADL's : Needs assistance/impaired Eating/Feeding: Modified independent;Sitting   Grooming: Modified independent;Sitting;Standing   Upper Body Bathing: Supervision/ safety;Sitting;Standing   Lower Body Bathing: Minimal assistance;Sitting/lateral leans;Sit to/from stand;Adhering to back precautions   Upper Body Dressing : Supervision/safety;Sitting;Standing   Lower Body Dressing: Minimal assistance;Sitting/lateral leans;Sit to/from stand;Adhering to back precautions   Toilet Transfer: Min guard;Rolling walker (2 wheels);Ambulation;Regular Toilet   Toileting- Clothing Manipulation and Hygiene: Supervision/safety;Sitting/lateral lean;Sit to/from stand       Functional mobility during ADLs: Min guard;Rolling walker (2 wheels)       Vision   Vision Assessment?: No apparent visual deficits     Perception     Praxis      Pertinent Vitals/Pain Pain Assessment Pain Assessment: Faces  Pain Score: 10-Worst pain ever Faces Pain Scale: Hurts worst Pain Location: incision Pain Descriptors / Indicators: Discomfort, Grimacing Pain  Intervention(s): Limited activity within patient's tolerance     Hand Dominance     Extremity/Trunk Assessment Upper Extremity Assessment Upper Extremity Assessment: Overall WFL for tasks assessed   Lower Extremity Assessment Lower Extremity Assessment: Defer to PT evaluation   Cervical / Trunk Assessment Cervical / Trunk Assessment: Back Surgery   Communication Communication Communication: No difficulties   Cognition Arousal/Alertness: Awake/alert Behavior During Therapy: WFL for tasks assessed/performed Overall Cognitive Status: Within Functional Limits for tasks assessed                                       General Comments  VSS on RA, spouse present in room during session    Exercises     Shoulder Instructions      Home Living Family/patient expects to be discharged to:: Private residence Living Arrangements: Spouse/significant other Available Help at Discharge: Family;Available 24 hours/day Type of Home: House Home Access: Stairs to enter [employer] of Steps: 5   Home Layout: One level     Bathroom Shower/Tub: Tub/shower unit;Walk-in shower     Bathroom Accessibility: Yes   Home Equipment: Agricultural consultant (2 wheels);Shower seat;BSC/3in1   Additional Comments: pt's mom is planning to come/assist at d/c      Prior Functioning/Environment Prior Level of Function : Independent/Modified Independent               ADLs Comments: does IADLs        OT Problem List: Decreased strength;Decreased range of motion;Decreased activity tolerance;Impaired balance (sitting and/or standing);Decreased knowledge of precautions      OT Treatment/Interventions: Self-care/ADL training;Therapeutic exercise;DME and/or AE instruction;Therapeutic activities;Patient/family education;Balance training    OT Goals(Current goals can be found in the care plan section) Acute Rehab OT Goals Patient Stated Goal: to go home OT Goal Formulation: With  patient Time For Goal Achievement: 12/16/21 Potential to Achieve Goals: Good ADL Goals Pt Will Perform Tub/Shower Transfer: Shower transfer;shower seat;ambulating;Independently  OT Frequency: Min 2X/week    Co-evaluation              AM-PAC OT "6 Clicks" Daily Activity     Outcome Measure Help from another person eating meals?: None Help from another person taking care of personal grooming?: None Help from another person toileting, which includes using toliet, bedpan, or urinal?: None Help from another person bathing (including washing, rinsing, drying)?: A Little Help from another person to put on and taking off regular upper body clothing?: None Help from another person to put on and taking off regular lower body clothing?: A Little 6 Click Score: 22   End of Session Equipment Utilized During Treatment: Gait belt;Rolling walker (2 wheels) Nurse Communication: Mobility status  Activity Tolerance: Patient tolerated treatment well Patient left: in bed;with call bell/phone within reach;with family/visitor present  OT Visit Diagnosis: Unsteadiness on feet (R26.81);Other abnormalities of gait and mobility (R26.89);Muscle weakness (generalized) (M62.81)                Time: 9147-8295 OT Time Calculation (min): 17 min Charges:  OT General Charges $OT Visit: 1 Visit OT Evaluation $OT Eval Low Complexity: 1 Low  Alfonzo Beers, OTD, OTR/L Acute Rehab [phone]   Mayer Masker 12/02/2021, 9:03 AM

## 2021-12-02 NOTE — Discharge Summary (Signed)
Discharge Summary  Date of Admission: 12/01/2021  Date of Discharge: 12/02/21  Attending Physician: Autumn Patty, MD  Hospital Course: Patient was admitted following an uncomplicated L5-S1 MIS TLIF. They were recovered in PACU and transferred to [facility]. Their preop symptoms were improved, their hospital course was uncomplicated and the patient was discharged home on 12/02/21. They will follow up in clinic with me in clinic in 2 weeks.  Neurologic exam at discharge:  Strength 5/5 x4 and SILTx4   Discharge diagnosis: Lumbar spondylolisthesis, lumbar radiculopathy  Jadene Pierini, MD 12/02/21 8:42 AM

## 2021-12-02 NOTE — Anesthesia Postprocedure Evaluation (Signed)
Anesthesia Post Note  Patient: Sharon Sullivan  Procedure(s) Performed: Left L5-S1 MIS TLIF (Left: Back)     Patient location during evaluation: PACU Anesthesia Type: General Level of consciousness: awake and alert Pain management: pain level controlled Vital Signs Assessment: post-procedure vital signs reviewed and stable Respiratory status: spontaneous breathing, nonlabored ventilation, respiratory function stable and patient connected to nasal cannula oxygen Cardiovascular status: blood pressure returned to baseline and stable Postop Assessment: no apparent nausea or vomiting Anesthetic complications: no   No notable events documented.  Last Vitals:  Vitals:   12/02/21 0357 12/02/21 0746  BP: (!) 109/57 96/61  Pulse: 79 95  Resp: 18 18  Temp: 37.1 C 36.9 C  SpO2: 97% 95%    Last Pain:  Vitals:   12/02/21 0746  TempSrc: Oral  PainSc:                  Peace Jost

## 2021-12-02 NOTE — Plan of Care (Signed)
Pt given D/C instructions with verbal understanding. Rx's were sent to the pharmacy by MD. Pt's incision is clean and dry with no sign of infection. Pt's IV was removed prior to D/C. Pt D/C'd home via wheelchair per MD order. Pt is stable @ D/C and has no other needs at this time. Francene Mcerlean, RN  

## 2021-12-02 NOTE — Progress Notes (Signed)
Neurosurgery Service Progress Note  Subjective: No acute events overnight, incisional pain as expected, but deeper preop back pain is substantially improved, she can sit up with less pain now, no radicular pain, she's happy overally   Objective: Vitals:   12/01/21 1935 12/01/21 2321 12/02/21 0357 12/02/21 0746  BP: 107/66 111/71 (!) 109/57 96/61  Pulse: 94 89 79 95  Resp: 18 18 18 18   Temp: 99.6 F (37.6 C) 99.9 F (37.7 C) 98.7 F (37.1 C) 98.5 F (36.9 C)  TempSrc: Oral Oral Oral Oral  SpO2: 96% 96% 97% 95%  Weight:      Height:        Physical Exam: Strength 5/5 x4 and SILTx4, incisions c/d/I, +superficial hematoma adjacent to L incision but soft nontedner with no skin changes  Assessment & Plan: 41 y.o. woman s/p L5-S1 MIS TLIF, recovering well.  -discharge home today -will send in a refill of her oxycodone, discussed she needs to call her pain physician prior to filling and discuss pain contract with them   46  12/02/21 8:38 AM

## 2021-12-04 ENCOUNTER — Encounter (HOSPITAL_COMMUNITY): Payer: Self-pay | Admitting: Neurological Surgery

## 2021-12-11 ENCOUNTER — Ambulatory Visit: Payer: Medicaid Other | Admitting: Physical Medicine and Rehabilitation

## 2021-12-11 ENCOUNTER — Other Ambulatory Visit: Payer: Self-pay

## 2021-12-13 ENCOUNTER — Other Ambulatory Visit: Payer: Self-pay

## 2021-12-13 MED ORDER — OXYCODONE HCL 15 MG PO TABS
ORAL_TABLET | ORAL | 0 refills | Status: AC
  Filled 2021-12-13: qty 120, 30d supply, fill #0

## 2021-12-14 ENCOUNTER — Other Ambulatory Visit: Payer: Self-pay

## 2022-06-18 ENCOUNTER — Ambulatory Visit (INDEPENDENT_AMBULATORY_CARE_PROVIDER_SITE_OTHER): Payer: Medicaid Other | Admitting: Student

## 2022-06-18 ENCOUNTER — Encounter: Payer: Self-pay | Admitting: Student

## 2022-06-18 VITALS — BP 125/88 | HR 90 | Ht 63.0 in | Wt 157.6 lb

## 2022-06-18 DIAGNOSIS — M4316 Spondylolisthesis, lumbar region: Secondary | ICD-10-CM

## 2022-06-18 DIAGNOSIS — Z7689 Persons encountering health services in other specified circumstances: Secondary | ICD-10-CM

## 2022-06-18 NOTE — Progress Notes (Signed)
  SUBJECTIVE:   CHIEF COMPLAINT / HPI:   PMHx: Back issues - Spondylolithesis Been going to him, got surgery in June, was supposed to get DEXA Depression Anxiety OB Hx 3 boys  PSHx: Gallbladder surgery - 2000 3 Knees surgery - 2001 Back surgery - 2023 Tubal ligation - 2010   FamHx: Grandfather/mom- stomach cancer Dad - stroke Mom - bad back and bad knee, MI x 2 Grandma/mom - Alzheimer  Meds: Diazapam 10 mg BID - Ortho Dr. Inda Castle, was for muscle spasms and depression Roxycodone 15 mg QID - Pain management Dr. Wynelle Link @ [facility] Buspar 10 mg BID   Allergies: None  Social  Work: disability   Home: Husband of 22 years and two boys (5 yo and 42 yo)  Sex: Active. 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    Appearance: She is not ill-appearing.  Cardiovascular:     Rate and Rhythm: Normal rate and regular rhythm.     Pulses: Normal pulses.     Heart sounds: Normal heart sounds. No murmur heard.    No friction rub. No gallop.  Pulmonary:     Effort: Pulmonary effort is normal. No respiratory distress.     Breath sounds: Normal breath sounds. No stridor. No wheezing, rhonchi or rales.  Abdominal:     General: Bowel sounds are normal. There is no distension.     Palpations: Abdomen is soft.     Tenderness: There is no abdominal tenderness. There is no guarding.  Neurological:     Mental Status: She is alert.      ASSESSMENT/PLAN:  Encounter to establish care Assessment & Plan: Patient comes in to establish care, and  looking for refill of opioid/benzodiazepine medication. Reviewed past history with patient, and informed patient the opioid/benzodiazepine medication not given out on first visit. Patient requesting Roxycodone 15 mg QID, and Diazepam 10 mg BID. Unsure how long patient has been w/o medication, but last refill noted for June from two separate pharmacies w/n a month. Provider will re-evaluate pain regimen with patient and not prescribe diazepam, as she reports hx of use for sleep and muscle spasms. Patient also to send medical record from [facility] and her Orthopedic Surgeon.     No follow-ups on file. [facility], MD 06/20/2022, 6:18 PM PGY-2, [facility]

## 2022-06-18 NOTE — Patient Instructions (Addendum)
It was great to see you! Thank you for allowing me to participate in your care!  We met to establish care today! It was a pleasure to get to know you!  Our plans for today:  - Make a follow up appointment to be seen to discuss your medications and screen for any other conditions - Please send medical records from previous provider Fax [phone]  Phone: [phone]  Take care and seek immediate care sooner if you develop any concerns.   Dr. Holley Bouche, MD [place]

## 2022-06-20 ENCOUNTER — Encounter: Payer: Self-pay | Admitting: Student

## 2022-06-20 DIAGNOSIS — Z7689 Persons encountering health services in other specified circumstances: Secondary | ICD-10-CM | POA: Insufficient documentation

## 2022-06-20 NOTE — Assessment & Plan Note (Addendum)
Patient comes in to establish care, and looking for refill of opioid/benzodiazepine medication. Reviewed past history with patient, and informed patient the opioid/benzodiazepine medication not given out on first visit. Patient requesting Roxycodone 15 mg QID, and Diazepam 10 mg BID. Unsure how long patient has been w/o medication, but last refill noted for June from two separate pharmacies w/n a month. Provider will re-evaluate pain regimen with patient and not prescribe diazepam, as she reports hx of use for sleep and muscle spasms. Patient also to send medical record from [facility] and her Orthopedic Surgeon.

## 2022-07-19 NOTE — Patient Instructions (Incomplete)
It was great to see you! Thank you for allowing me to participate in your care!  I recommend that you always bring your medications to each appointment as this makes it easy to ensure we are on the correct medications and helps us not miss when refills are needed.  Our plans for today:  - *** -   We are checking some labs today, I will call you if they are abnormal will send you a MyChart message or a letter if they are normal.  If you do not hear about your labs in the next 2 weeks please let us know.***  Take care and seek immediate care sooner if you develop any concerns.   Dr. Krishawna Stiefel, MD [facility]  

## 2022-07-19 NOTE — Progress Notes (Signed)
  SUBJECTIVE:   CHIEF COMPLAINT / HPI:   F/u chronic low back pain Per last visit 06/18/22: Patient wanting refill on home meds.  -Diazapam 10 mg BID - Ortho Dr. Richrd Prime, was for muscle spasms and depression -Roxycodone 15 mg QID - Pain management Dr. Nancy Fetter @ [facility]  Patient notes she's been out of her medicine for 2 months and is notable pain. Notes that surgery didn't help with pain/mobility. She notes the pain is in her tailbone and in her calves/ankles. Pain is as bad as it was prior to surgery, after the accident.    PERTINENT  PMH / PSH: ***  Past Medical History:  Diagnosis Date   Anxiety    Chronic low back pain    Depression     OBJECTIVE:  There were no vitals taken for this visit. Physical Exam   ASSESSMENT/PLAN:  There are no diagnoses linked to this encounter. No follow-ups on file. Holley Bouche, MD 07/19/2022, 3:16 PM PGY-***, [facility] {    This will disappear when note is signed, click to select method of visit    :1}

## 2022-07-20 ENCOUNTER — Ambulatory Visit: Payer: Medicaid Other | Admitting: Student

## 2022-07-20 ENCOUNTER — Encounter: Payer: Self-pay | Admitting: Student

## 2022-07-20 VITALS — BP 116/80 | HR 81 | Ht 63.0 in | Wt 158.6 lb

## 2022-07-20 DIAGNOSIS — G8929 Other chronic pain: Secondary | ICD-10-CM | POA: Diagnosis not present

## 2022-07-20 DIAGNOSIS — M545 Low back pain, unspecified: Secondary | ICD-10-CM | POA: Diagnosis not present

## 2022-07-20 MED ORDER — CYCLOBENZAPRINE HCL 10 MG PO TABS: 10.0000 mg | ORAL_TABLET | Freq: Two times a day (BID) | ORAL | 0 refills | Status: AC | PRN

## 2022-07-21 NOTE — Assessment & Plan Note (Addendum)
Patient presents for medication to treat her low back pain. Patient note's she's been out of meds for 2 months and is requesting 15 mg oxycodone QID, and 10 mg diazepam for depression and muscle spasms. I informed patient that this would be poor care, and I could not start her back on those medicines. Provider believes this dosing of medication is not good care for patient, and will not start an opiod. Will attempt to treat pain w/ alternative modalities. Told patient we could start with NSAID's and Tylenol, with a muscle relaxer for breakthrough pain. Patient upset and distressed by news, states she is not drug seeking and that these meds wont help. Patient was agreeable to pain regimen and close f/u. -Ibuprofen 400 mg TID -Tylenol 500 mg TID -Stagger medications by 4 hours -Flexeril 10 mg prn (1-2x a day) -F/u q wk prn

## 2022-08-09 ENCOUNTER — Ambulatory Visit: Payer: Medicaid Other | Admitting: Student

## 2022-08-09 NOTE — Patient Instructions (Incomplete)
It was great to see you! Thank you for allowing me to participate in your care!  I recommend that you always bring your medications to each appointment as this makes it easy to ensure we are on the correct medications and helps us not miss when refills are needed.  Our plans for today:  - *** -   We are checking some labs today, I will call you if they are abnormal will send you a MyChart message or a letter if they are normal.  If you do not hear about your labs in the next 2 weeks please let us know.***  Take care and seek immediate care sooner if you develop any concerns.   Dr. Damaya Channing, MD [facility]  

## 2022-08-09 NOTE — Progress Notes (Deleted)
  SUBJECTIVE:   CHIEF COMPLAINT / HPI:   Seen 07/20/22 for chronic back pain regimen belwo -Ibuprofen 400 mg TID -Tylenol 500 mg TID -Stagger medications by 4 hours -Flexeril 10 mg prn (1-2x a day)    PERTINENT  PMH / PSH: ***  Past Medical History:  Diagnosis Date   Anxiety    Chronic low back pain    Depression     OBJECTIVE:  LMP  (LMP Unknown)  Physical Exam   ASSESSMENT/PLAN:  There are no diagnoses linked to this encounter. No follow-ups on file. Holley Bouche, MD 08/09/2022, 6:38 AM PGY-***, [facility] {    This will disappear when note is signed, click to select method of visit    :1}

## 2023-11-29 ENCOUNTER — Encounter: Payer: Self-pay | Admitting: *Deleted

## 2023-12-26 IMAGING — RF DG LUMBAR SPINE 2-3V
1 series · 2 of 2 positions shown · non-contrast
Comparison: none

CLINICAL DATA: [id number] patient with TLIF of L5-S1, to check for
alignment

EXAM:
LUMBAR SPINE - 2-3 VIEW

[Series 1: run · 2 of 2 slices shown]
[im 1/2]
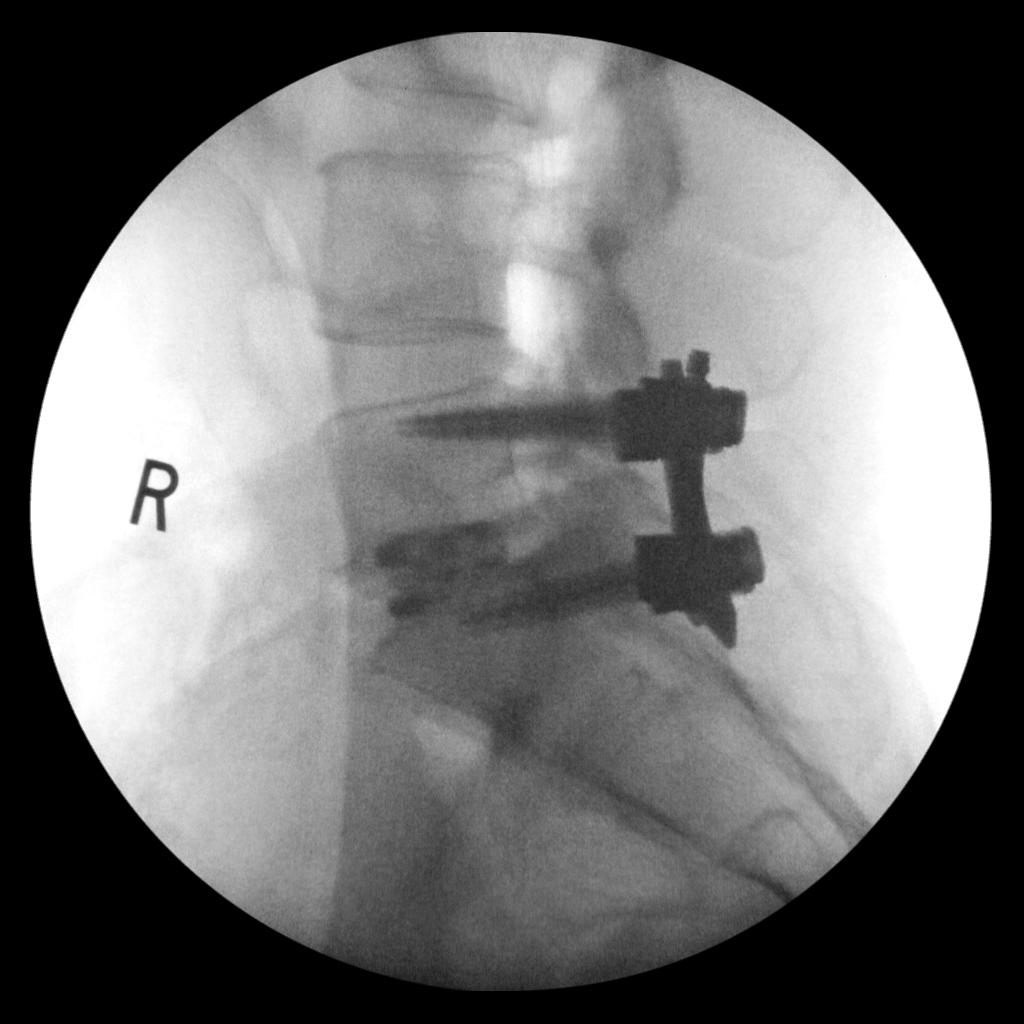
[im 2/2]
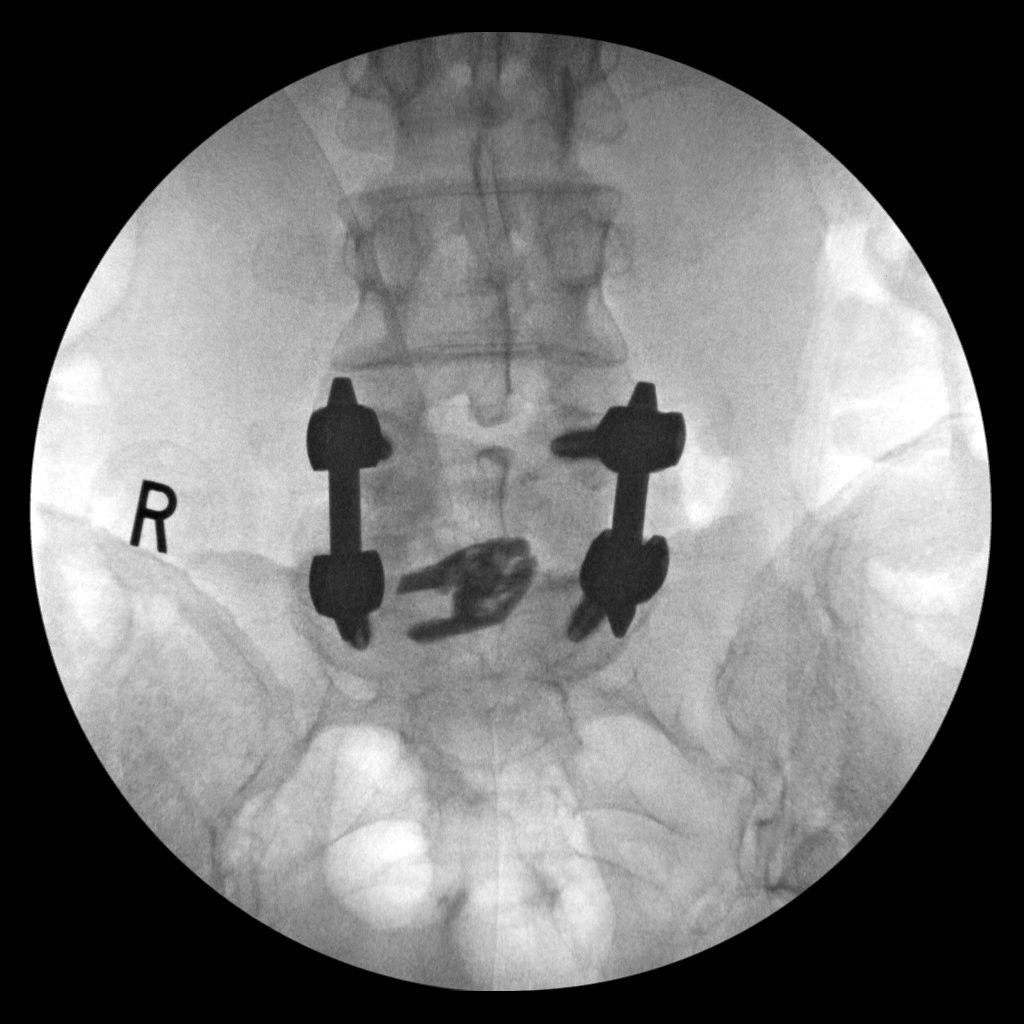

[2 of 2 positions shown; findings below may reference images not displayed]

FINDINGS: PA and cross-table lateral views of the lumbosacral spine are
obtained intraoperatively there are trans-bipedicular screw devices
seen at L5 and S1 with an intervertebral disc cage. The alignment is
satisfactory.
IMPRESSION: Post lumbosacral fusion changes at L5-S1 with an intervertebral disc
cage.

## 2024-04-16 ENCOUNTER — Other Ambulatory Visit (HOSPITAL_BASED_OUTPATIENT_CLINIC_OR_DEPARTMENT_OTHER): Payer: Self-pay

## 2024-04-16 MED ORDER — CLONAZEPAM 0.5 MG PO TABS
0.5000 mg | ORAL_TABLET | Freq: Two times a day (BID) | ORAL | 0 refills | Status: DC | PRN
  Filled 2024-04-16: qty 14, 7d supply, fill #0

## 2024-04-16 MED ORDER — OXYCODONE HCL 15 MG PO TABS
15.0000 mg | ORAL_TABLET | Freq: Four times a day (QID) | ORAL | 0 refills | Status: AC | PRN
  Filled 2024-04-16: qty 72, 18d supply, fill #0

## 2024-04-17 ENCOUNTER — Other Ambulatory Visit (HOSPITAL_BASED_OUTPATIENT_CLINIC_OR_DEPARTMENT_OTHER): Payer: Self-pay

## 2024-04-17 MED ORDER — WEGOVY 2.4 MG/0.75ML ~~LOC~~ SOAJ
2.4000 mg | SUBCUTANEOUS | 0 refills | Status: AC
  Filled 2024-04-17: qty 3, 28d supply, fill #0

## 2024-04-17 MED ORDER — BACLOFEN 20 MG PO TABS
20.0000 mg | ORAL_TABLET | Freq: Four times a day (QID) | ORAL | 0 refills | Status: AC | PRN
  Filled 2024-04-17: qty 120, 30d supply, fill #0

## 2024-04-18 ENCOUNTER — Other Ambulatory Visit (HOSPITAL_BASED_OUTPATIENT_CLINIC_OR_DEPARTMENT_OTHER): Payer: Self-pay

## 2024-04-19 ENCOUNTER — Other Ambulatory Visit (HOSPITAL_BASED_OUTPATIENT_CLINIC_OR_DEPARTMENT_OTHER): Payer: Self-pay

## 2024-04-24 ENCOUNTER — Other Ambulatory Visit (HOSPITAL_BASED_OUTPATIENT_CLINIC_OR_DEPARTMENT_OTHER): Payer: Self-pay

## 2024-04-30 ENCOUNTER — Other Ambulatory Visit (HOSPITAL_BASED_OUTPATIENT_CLINIC_OR_DEPARTMENT_OTHER): Payer: Self-pay

## 2024-05-03 ENCOUNTER — Other Ambulatory Visit (HOSPITAL_BASED_OUTPATIENT_CLINIC_OR_DEPARTMENT_OTHER): Payer: Self-pay

## 2024-05-03 MED ORDER — CLONAZEPAM 0.5 MG PO TABS
0.5000 mg | ORAL_TABLET | Freq: Two times a day (BID) | ORAL | 0 refills | Status: DC | PRN
  Filled 2024-05-03: qty 60, 30d supply, fill #0

## 2024-05-03 MED ORDER — OXYCODONE HCL 15 MG PO TABS
15.0000 mg | ORAL_TABLET | ORAL | 0 refills | Status: AC
  Filled 2024-05-03: qty 150, 30d supply, fill #0

## 2024-05-04 ENCOUNTER — Other Ambulatory Visit (HOSPITAL_BASED_OUTPATIENT_CLINIC_OR_DEPARTMENT_OTHER): Payer: Self-pay

## 2024-05-08 ENCOUNTER — Other Ambulatory Visit (HOSPITAL_BASED_OUTPATIENT_CLINIC_OR_DEPARTMENT_OTHER): Payer: Self-pay

## 2024-05-10 ENCOUNTER — Other Ambulatory Visit (HOSPITAL_BASED_OUTPATIENT_CLINIC_OR_DEPARTMENT_OTHER): Payer: Self-pay

## 2024-05-11 ENCOUNTER — Other Ambulatory Visit (HOSPITAL_BASED_OUTPATIENT_CLINIC_OR_DEPARTMENT_OTHER): Payer: Self-pay

## 2024-05-16 ENCOUNTER — Other Ambulatory Visit (HOSPITAL_BASED_OUTPATIENT_CLINIC_OR_DEPARTMENT_OTHER): Payer: Self-pay

## 2024-05-31 ENCOUNTER — Encounter (HOSPITAL_BASED_OUTPATIENT_CLINIC_OR_DEPARTMENT_OTHER): Payer: Self-pay | Admitting: Pharmacy Technician

## 2024-05-31 ENCOUNTER — Other Ambulatory Visit (HOSPITAL_BASED_OUTPATIENT_CLINIC_OR_DEPARTMENT_OTHER): Payer: Self-pay

## 2024-05-31 ENCOUNTER — Other Ambulatory Visit: Payer: Self-pay

## 2024-05-31 MED ORDER — OXYCODONE HCL 10 MG PO TABS
10.0000 mg | ORAL_TABLET | Freq: Four times a day (QID) | ORAL | 0 refills | Status: DC | PRN
  Filled 2024-05-31: qty 56, 14d supply, fill #0

## 2024-05-31 MED ORDER — OXYCODONE HCL 15 MG PO TABS
15.0000 mg | ORAL_TABLET | ORAL | 0 refills | Status: AC | PRN
  Filled 2024-05-31: qty 150, 30d supply, fill #0

## 2024-05-31 MED ORDER — CLONAZEPAM 0.5 MG PO TABS
0.5000 mg | ORAL_TABLET | Freq: Two times a day (BID) | ORAL | 0 refills | Status: DC | PRN
  Filled 2024-05-31: qty 60, 30d supply, fill #0

## 2024-05-31 MED FILL — Gabapentin Cap 300 MG: 300.0000 mg | ORAL | 30 days supply | Qty: 90 | Fill #0 | Status: CN

## 2024-06-22 ENCOUNTER — Other Ambulatory Visit (HOSPITAL_BASED_OUTPATIENT_CLINIC_OR_DEPARTMENT_OTHER): Payer: Self-pay

## 2024-07-03 ENCOUNTER — Other Ambulatory Visit (HOSPITAL_BASED_OUTPATIENT_CLINIC_OR_DEPARTMENT_OTHER): Payer: Self-pay

## 2024-07-03 MED ORDER — CLONAZEPAM 0.5 MG PO TABS
0.5000 mg | ORAL_TABLET | Freq: Two times a day (BID) | ORAL | 0 refills | Status: AC | PRN
  Filled 2024-07-03: qty 60, 30d supply, fill #0

## 2024-07-03 MED ORDER — OXYCODONE HCL 15 MG PO TABS
15.0000 mg | ORAL_TABLET | ORAL | 0 refills | Status: AC
  Filled 2024-07-03: qty 150, 30d supply, fill #0

## 2024-07-03 MED ORDER — OXYCODONE HCL 10 MG PO TABS
10.0000 mg | ORAL_TABLET | Freq: Four times a day (QID) | ORAL | 0 refills | Status: AC | PRN
  Filled 2024-07-03 (×2): qty 56, 14d supply, fill #0

## 2024-07-03 MED ORDER — GABAPENTIN 300 MG PO CAPS
300.0000 mg | ORAL_CAPSULE | Freq: Three times a day (TID) | ORAL | 0 refills | Status: AC | PRN
  Filled 2024-07-03: qty 90, 30d supply, fill #0

## 2024-07-05 ENCOUNTER — Other Ambulatory Visit (HOSPITAL_BASED_OUTPATIENT_CLINIC_OR_DEPARTMENT_OTHER): Payer: Self-pay

## 2024-07-05 MED ORDER — PHENTERMINE HCL 37.5 MG PO TABS
37.5000 mg | ORAL_TABLET | Freq: Every day | ORAL | 0 refills | Status: AC
  Filled 2024-07-05: qty 30, 30d supply, fill #0

## 2024-07-05 MED ORDER — WEGOVY 2.4 MG/0.75ML ~~LOC~~ SOAJ
2.4000 mg | SUBCUTANEOUS | 0 refills | Status: AC
  Filled 2024-07-05: qty 3, 28d supply, fill #0

## 2024-07-09 ENCOUNTER — Other Ambulatory Visit (HOSPITAL_BASED_OUTPATIENT_CLINIC_OR_DEPARTMENT_OTHER): Payer: Self-pay

## 2024-07-25 ENCOUNTER — Other Ambulatory Visit (HOSPITAL_BASED_OUTPATIENT_CLINIC_OR_DEPARTMENT_OTHER): Payer: Self-pay

## 2024-07-26 ENCOUNTER — Other Ambulatory Visit (HOSPITAL_BASED_OUTPATIENT_CLINIC_OR_DEPARTMENT_OTHER): Payer: Self-pay
